# Patient Record
Sex: Female | Born: 1975 | Race: White | Hispanic: No | Marital: Married | Smoking: Never smoker
Health system: Southern US, Community
[De-identification: ages and names within clinical notes are randomized; demographics above are authoritative.]

## PROBLEM LIST (undated history)

## (undated) DIAGNOSIS — Z9889 Other specified postprocedural states: Secondary | ICD-10-CM

## (undated) DIAGNOSIS — R112 Nausea with vomiting, unspecified: Secondary | ICD-10-CM

## (undated) DIAGNOSIS — E039 Hypothyroidism, unspecified: Secondary | ICD-10-CM

## (undated) DIAGNOSIS — J309 Allergic rhinitis, unspecified: Secondary | ICD-10-CM

## (undated) DIAGNOSIS — C801 Malignant (primary) neoplasm, unspecified: Secondary | ICD-10-CM

## (undated) HISTORY — PX: BREAST BIOPSY: SHX20

## (undated) HISTORY — DX: Allergic rhinitis, unspecified: J30.9

---

## 1994-01-03 HISTORY — PX: TONSILLECTOMY: SUR1361

## 2014-01-03 HISTORY — PX: TUBAL LIGATION: SHX77

## 2016-01-04 HISTORY — PX: ABDOMINAL HYSTERECTOMY: SHX81

## 2016-11-29 HISTORY — PX: HYSTEROTOMY: SHX1776

## 2017-01-06 DIAGNOSIS — E069 Thyroiditis, unspecified: Secondary | ICD-10-CM | POA: Diagnosis not present

## 2017-01-06 DIAGNOSIS — E0590 Thyrotoxicosis, unspecified without thyrotoxic crisis or storm: Secondary | ICD-10-CM | POA: Diagnosis not present

## 2017-01-06 DIAGNOSIS — E041 Nontoxic single thyroid nodule: Secondary | ICD-10-CM | POA: Diagnosis not present

## 2017-01-06 DIAGNOSIS — J342 Deviated nasal septum: Secondary | ICD-10-CM | POA: Diagnosis not present

## 2017-01-10 ENCOUNTER — Ambulatory Visit: Payer: BLUE CROSS/BLUE SHIELD | Admitting: Endocrinology

## 2017-01-10 ENCOUNTER — Encounter: Payer: Self-pay | Admitting: Endocrinology

## 2017-01-10 DIAGNOSIS — E0590 Thyrotoxicosis, unspecified without thyrotoxic crisis or storm: Secondary | ICD-10-CM

## 2017-01-10 LAB — TSH

## 2017-01-10 LAB — T4, FREE: FREE T4: 4.41 ng/dL — AB (ref 0.60–1.60)

## 2017-01-10 MED ORDER — METHIMAZOLE 10 MG PO TABS: 20.0000 mg | ORAL_TABLET | Freq: Two times a day (BID) | ORAL | 2 refills | Status: DC

## 2017-01-10 NOTE — Progress Notes (Signed)
Subjective:    Patient ID: Kaitlin Patterson, female    DOB: 1975-11-21, 42 y.o.   MRN: [id number]  HPI Pt is referred for hyperthyroidism.  Pt reports he was dx'ed with hyperthyroidism in 2018.  she has never been on therapy for this.  she has never had XRT to the anterior neck, or thyroid surgery.  She does not consume kelp or any other prescribed or non-prescribed thyroid medication.  she has never been on amiodarone.  Pt reports 1 year of slight swelling at the left ant neck. She has assoc pain x 1 month.  1 month ago, she was rx'ed prednisone dosepack, which helped temporarily.   No past medical history on file.  Past Surgical History:  Procedure Laterality Date  . CESAREAN SECTION N/A 2016   cesarean done when patient had twins  . HYSTEROTOMY Bilateral 11/29/2016   Partial all taken but overies.     Social History   Socioeconomic History  . Marital status: Married    Spouse name: Not on file  . Number of children: Not on file  . Years of education: Not on file  . Highest education level: Not on file  Social Needs  . Financial resource strain: Not on file  . Food insecurity - worry: Not on file  . Food insecurity - inability: Not on file  . Transportation needs - medical: Not on file  . Transportation needs - non-medical: Not on file  Occupational History  . Not on file  Tobacco Use  . Smoking status: Never Smoker  . Smokeless tobacco: Never Used  Substance and Sexual Activity  . Alcohol use: No    Frequency: Never  . Drug use: No  . Sexual activity: Not Currently    Partners: Male  Other Topics Concern  . Not on file  Social History Narrative  . Not on file    Current Outpatient Medications on File Prior to Visit  Medication Sig Dispense Refill  . Biotin 10000 MCG TABS Take 10 mcg by mouth 2 (two) times daily.    . diphenhydrAMINE (BENADRYL) 25 MG tablet Take 25 mg by mouth daily.    Marland Kitchen ibuprofen (ADVIL,MOTRIN) 200 MG tablet Take 200 mg by mouth daily as needed.    .  Multiple Vitamin (MULTI-VITAMIN PO) Take by mouth.     No current facility-administered medications on file prior to visit.     Allergies  Allergen Reactions  . Penicillins Rash    Family History  Problem Relation Age of Onset  . Hypothyroidism Mother     BP 94/62 (BP Location: Left Arm, Patient Position: Sitting, Cuff Size: Normal)   Pulse (!) 115   Wt 131 lb 9.6 oz (59.7 kg)   LMP 11/05/2016 (Approximate)   SpO2 98%    Review of Systems denies hoarseness, diplopia, fever, sob, polyuria, muscle weakness, edema, excessive diaphoresis, tremor, easy bruising, and rhinorrhea.  She has myalgias, freq BM's, heat intolerance, nausea, palpitations, headache, and anxiety.  She has lost a few lbs.       Objective:   Physical Exam VS: see vs page GEN: no distress HEAD: head: no deformity eyes: no periorbital swelling, no proptosis external nose and ears are normal mouth: no lesion seen NECK: exquisite guarding to light palpation.  This limits ewxam, but there appear to be slight enlargement R>L  CHEST WALL: no deformity.  LUNGS: clear to auscultation CV: reg rate and rhythm, no murmur ABD: abdomen is soft, nontender.  no hepatosplenomegaly.  not  distended.  no hernia MUSCULOSKELETAL: muscle bulk and strength are grossly normal.  no obvious joint swelling.  gait is normal and steady EXTEMITIES: no deformity.  no leg edema PULSES: DP pulses are intact bilaterally.   NEURO:  cn 2-12 grossly intact.   readily moves all 4's.  sensation is intact to touch on all 4's.  No tremor SKIN:  Normal texture and temperature.  No rash or suspicious lesion is visible.  Not diaphoretic. NODES:  None palpable at the neck. PSYCH: alert, well-oriented.  Does not appear anxious nor depressed.    outside test results are reviewed: TSH=0.12  [place]: small multinodular goiter (L>R, opposite of today's physical exam).    I have reviewed outside records, and summarized: Pt was noted to have suppressed  TSH, and referred here. Main symptoms were.  She saw ENT also, who advised against thyroidectomy.       Assessment & Plan:  Hyperthyroidism, new, uncertain etiology.  She has several factors favoring subacute thyroiditis.  Hypotension: in this setting, she cannot take a B-blocker for tachycardia.   Patient Instructions  blood tests are requested for you today.  We'll let you know about the results. If it is overactive, i'll prescribe for you a pill to slow the thyroid. Please come back for a follow-up appointment in 1 month.

## 2017-01-10 NOTE — Patient Instructions (Addendum)
blood tests are requested for you today.  We'll let you know about the results. If it is overactive, i'll prescribe for you a pill to slow the thyroid. Please come back for a follow-up appointment in 1 month.

## 2017-01-30 DIAGNOSIS — R928 Other abnormal and inconclusive findings on diagnostic imaging of breast: Secondary | ICD-10-CM | POA: Diagnosis not present

## 2017-01-30 DIAGNOSIS — R921 Mammographic calcification found on diagnostic imaging of breast: Secondary | ICD-10-CM | POA: Diagnosis not present

## 2017-02-10 ENCOUNTER — Ambulatory Visit: Payer: BLUE CROSS/BLUE SHIELD | Admitting: Endocrinology

## 2017-02-10 ENCOUNTER — Encounter: Payer: Self-pay | Admitting: Endocrinology

## 2017-02-10 VITALS — BP 110/64 | HR 73 | Wt 128.0 lb

## 2017-02-10 DIAGNOSIS — E0590 Thyrotoxicosis, unspecified without thyrotoxic crisis or storm: Secondary | ICD-10-CM

## 2017-02-10 LAB — T4, FREE: Free T4: 0.89 ng/dL (ref 0.60–1.60)

## 2017-02-10 LAB — TSH: TSH: <0.01 u[IU]/mL — ABNORMAL LOW (ref 0.35–4.50)

## 2017-02-10 MED ORDER — METHIMAZOLE 10 MG PO TABS: 10.0000 mg | ORAL_TABLET | Freq: Two times a day (BID) | ORAL | 2 refills | Status: DC

## 2017-02-10 NOTE — Progress Notes (Signed)
   Subjective:    Patient ID: Kaitlin Patterson, female    DOB: 06-19-1975, 42 y.o.   MRN: [id number]  HPI Pt returns for f/u of hyperthyroidism (dx'ed 2018; she chose tapazole rx; US showed small multinodular goiter; ENT advised against thyroidectomy).  On tapazole, pt states she feels much better in general.  Specifically, tremor and palpitations are resolved.  Past Medical History:  Diagnosis Date  . Allergic rhinitis     Past Surgical History:  Procedure Laterality Date  . CESAREAN SECTION N/A 2016   cesarean done when patient had twins  . HYSTEROTOMY Bilateral 11/29/2016   Partial all taken but overies.     Social History   Socioeconomic History  . Marital status: Married    Spouse name: Not on file  . Number of children: Not on file  . Years of education: Not on file  . Highest education level: Not on file  Social Needs  . Financial resource strain: Not on file  . Food insecurity - worry: Not on file  . Food insecurity - inability: Not on file  . Transportation needs - medical: Not on file  . Transportation needs - non-medical: Not on file  Occupational History  . Not on file  Tobacco Use  . Smoking status: Never Smoker  . Smokeless tobacco: Never Used  Substance and Sexual Activity  . Alcohol use: No    Frequency: Never  . Drug use: No  . Sexual activity: Not Currently    Partners: Male  Other Topics Concern  . Not on file  Social History Narrative  . Not on file    Current Outpatient Medications on File Prior to Visit  Medication Sig Dispense Refill  . diphenhydrAMINE (BENADRYL) 25 MG tablet Take 25 mg by mouth daily.    Marland Kitchen ibuprofen (ADVIL,MOTRIN) 200 MG tablet Take 200 mg by mouth daily as needed.    . Biotin 10000 MCG TABS Take 10 mcg by mouth 2 (two) times daily.    . Multiple Vitamin (MULTI-VITAMIN PO) Take by mouth.     No current facility-administered medications on file prior to visit.     Allergies  Allergen Reactions  . Penicillins Rash     Family History  Problem Relation Age of Onset  . Hypothyroidism Mother     BP 110/64 (BP Location: Left Arm, Patient Position: Sitting, Cuff Size: Normal)   Pulse 73   Wt 128 lb (58.1 kg)   LMP 11/05/2016 (Approximate)   SpO2 98%   Review of Systems Denies fever.     Objective:   Physical Exam VITAL SIGNS:  See vs page GENERAL: no distress NECK: There is no palpable thyroid enlargement.  No thyroid nodule is palpable.  No palpable lymphadenopathy at the anterior neck. Neuro: no tremor Skin: not diaphoretic      Assessment & Plan:  Hyperthyroidism: due for recheck  Patient Instructions  blood tests are requested for you today.  We'll let you know about the results. If ever you have fever while taking methimazole, stop it and call us, even if the reason is obvious, because of the risk of a rare side-effect.  Please come back for a follow-up appointment in 6 weeks.

## 2017-02-10 NOTE — Patient Instructions (Addendum)
blood tests are requested for you today.  We'll let you know about the results. If ever you have fever while taking methimazole, stop it and call us, even if the reason is obvious, because of the risk of a rare side-effect. Please come back for a follow-up appointment in 6 weeks.  

## 2017-02-12 ENCOUNTER — Encounter: Payer: Self-pay | Admitting: Endocrinology

## 2017-02-28 ENCOUNTER — Telehealth: Payer: Self-pay | Admitting: Endocrinology

## 2017-02-28 NOTE — Telephone Encounter (Signed)
Most recent office note faxed per request.

## 2017-02-28 NOTE — Telephone Encounter (Signed)
°  [facility] are requesting the patients office notes faxed over to them   Thanks!      Fax- [phone]

## 2017-03-24 ENCOUNTER — Ambulatory Visit: Payer: BLUE CROSS/BLUE SHIELD | Admitting: Endocrinology

## 2017-03-24 ENCOUNTER — Encounter: Payer: Self-pay | Admitting: Endocrinology

## 2017-03-24 VITALS — BP 108/68 | HR 77 | Wt 134.0 lb

## 2017-03-24 DIAGNOSIS — E0590 Thyrotoxicosis, unspecified without thyrotoxic crisis or storm: Secondary | ICD-10-CM | POA: Diagnosis not present

## 2017-03-24 LAB — TSH: TSH: 77.53 u[IU]/mL — ABNORMAL HIGH (ref 0.35–4.50)

## 2017-03-24 LAB — T4, FREE: FREE T4: 0.33 ng/dL — AB (ref 0.60–1.60)

## 2017-03-24 MED ORDER — LEVOTHYROXINE SODIUM 75 MCG PO TABS: 75.0000 ug | ORAL_TABLET | Freq: Every day | ORAL | 1 refills | Status: DC

## 2017-03-24 NOTE — Patient Instructions (Addendum)
blood tests are requested for you today.  We'll let you know about the results.   If ever you have fever while taking methimazole, stop it and call us, even if the reason is obvious, because of the risk of a rare side-effect.  Please come back for a follow-up appointment in 2 months.   

## 2017-03-24 NOTE — Progress Notes (Signed)
Subjective:    Patient ID: Kaitlin Patterson, female    DOB: 1975-03-20, 42 y.o.   MRN: [id number]  HPI Pt returns for f/u of hyperthyroidism (dx'ed 2018; she chose tapazole rx; US showed small multinodular goiter; ENT advised against thyroidectomy).  On tapazole, pt states she feels much better in general.  Specifically, tremor and palpitations are resolved.  Past Medical History:  Diagnosis Date  . Allergic rhinitis     Past Surgical History:  Procedure Laterality Date  . CESAREAN SECTION N/A 2016   cesarean done when patient had twins  . HYSTEROTOMY Bilateral 11/29/2016   Partial all taken but overies.     Social History   Socioeconomic History  . Marital status: Married    Spouse name: Not on file  . Number of children: Not on file  . Years of education: Not on file  . Highest education level: Not on file  Occupational History  . Not on file  Social Needs  . Financial resource strain: Not on file  . Food insecurity:    Worry: Not on file    Inability: Not on file  . Transportation needs:    Medical: Not on file    Non-medical: Not on file  Tobacco Use  . Smoking status: Never Smoker  . Smokeless tobacco: Never Used  Substance and Sexual Activity  . Alcohol use: No    Frequency: Never  . Drug use: No  . Sexual activity: Not Currently    Partners: Male  Lifestyle  . Physical activity:    Days per week: Not on file    Minutes per session: Not on file  . Stress: Not on file  Relationships  . Social connections:    Talks on phone: Not on file    Gets together: Not on file    Attends religious service: Not on file    Active member of club or organization: Not on file    Attends meetings of clubs or organizations: Not on file    Relationship status: Not on file  . Intimate partner violence:    Fear of current or ex partner: Not on file    Emotionally abused: Not on file    Physically abused: Not on file    Forced sexual activity: Not on file  Other Topics Concern   . Not on file  Social History Narrative  . Not on file    Current Outpatient Medications on File Prior to Visit  Medication Sig Dispense Refill  . diphenhydrAMINE (BENADRYL) 25 MG tablet Take 25 mg by mouth daily.    Marland Kitchen ibuprofen (ADVIL,MOTRIN) 200 MG tablet Take 200 mg by mouth daily as needed.    . Biotin 10000 MCG TABS Take 10 mcg by mouth 2 (two) times daily.    . Multiple Vitamin (MULTI-VITAMIN PO) Take by mouth.     No current facility-administered medications on file prior to visit.     Allergies  Allergen Reactions  . Penicillins Rash    Family History  Problem Relation Age of Onset  . Hypothyroidism Mother     BP 108/68 (BP Location: Left Arm, Patient Position: Sitting, Cuff Size: Normal)   Pulse 77   Wt 134 lb (60.8 kg)   LMP 11/05/2016 (Approximate)   SpO2 99%    Review of Systems Denies fever    Objective:   Physical Exam VITAL SIGNS:  See vs page GENERAL: no distress NECK: There is no palpable thyroid enlargement.  No thyroid nodule is  palpable.  No palpable lymphadenopathy at the anterior neck. Neuro: no tremor Skin: not diaphoretic   Lab Results  Component Value Date   TSH 77.53 (H) 03/24/2017      Assessment & Plan:  Subacute thyroiditis, now in hypothyroid phase.    Patient Instructions  blood tests are requested for you today.  We'll let you know about the results. If ever you have fever while taking methimazole, stop it and call us, even if the reason is obvious, because of the risk of a rare side-effect.  Please come back for a follow-up appointment in 2 months.

## 2017-04-07 ENCOUNTER — Other Ambulatory Visit (INDEPENDENT_AMBULATORY_CARE_PROVIDER_SITE_OTHER): Payer: BLUE CROSS/BLUE SHIELD

## 2017-04-07 DIAGNOSIS — E0590 Thyrotoxicosis, unspecified without thyrotoxic crisis or storm: Secondary | ICD-10-CM | POA: Diagnosis not present

## 2017-04-07 LAB — T4, FREE: Free T4: 0.91 ng/dL (ref 0.60–1.60)

## 2017-04-07 LAB — TSH: TSH: 3.41 u[IU]/mL (ref 0.35–4.50)

## 2017-05-17 ENCOUNTER — Other Ambulatory Visit: Payer: Self-pay | Admitting: Endocrinology

## 2017-05-24 ENCOUNTER — Encounter: Payer: Self-pay | Admitting: Endocrinology

## 2017-05-24 ENCOUNTER — Ambulatory Visit: Payer: BLUE CROSS/BLUE SHIELD | Admitting: Endocrinology

## 2017-05-24 DIAGNOSIS — E061 Subacute thyroiditis: Secondary | ICD-10-CM | POA: Insufficient documentation

## 2017-05-24 LAB — TSH: TSH: 1.82 u[IU]/mL (ref 0.35–4.50)

## 2017-05-24 LAB — T4, FREE: FREE T4: 0.97 ng/dL (ref 0.60–1.60)

## 2017-05-24 NOTE — Progress Notes (Signed)
Subjective:    Patient ID: Kaitlin Patterson, female    DOB: September 15, 1975, 42 y.o.   MRN: [id number]  HPI Pt returns for f/u of subacute thyroiditis (dx'ed 2018; she chose tapazole rx; US showed small multinodular goiter; ENT advised against thyroidectomy; in ealry 2019, she developed hypothyroidism, and started synthroid).  pt states she feels well in general.  Past Medical History:  Diagnosis Date  . Allergic rhinitis     Past Surgical History:  Procedure Laterality Date  . CESAREAN SECTION N/A 2016   cesarean done when patient had twins  . HYSTEROTOMY Bilateral 11/29/2016   Partial all taken but overies.     Social History   Socioeconomic History  . Marital status: Married    Spouse name: Not on file  . Number of children: Not on file  . Years of education: Not on file  . Highest education level: Not on file  Occupational History  . Not on file  Social Needs  . Financial resource strain: Not on file  . Food insecurity:    Worry: Not on file    Inability: Not on file  . Transportation needs:    Medical: Not on file    Non-medical: Not on file  Tobacco Use  . Smoking status: Never Smoker  . Smokeless tobacco: Never Used  Substance and Sexual Activity  . Alcohol use: No    Frequency: Never  . Drug use: No  . Sexual activity: Not Currently    Partners: Male  Lifestyle  . Physical activity:    Days per week: Not on file    Minutes per session: Not on file  . Stress: Not on file  Relationships  . Social connections:    Talks on phone: Not on file    Gets together: Not on file    Attends religious service: Not on file    Active member of club or organization: Not on file    Attends meetings of clubs or organizations: Not on file    Relationship status: Not on file  . Intimate partner violence:    Fear of current or ex partner: Not on file    Emotionally abused: Not on file    Physically abused: Not on file    Forced sexual activity: Not on file  Other Topics Concern   . Not on file  Social History Narrative  . Not on file    Current Outpatient Medications on File Prior to Visit  Medication Sig Dispense Refill  . diphenhydrAMINE (BENADRYL) 25 MG tablet Take 25 mg by mouth daily.    Marland Kitchen ibuprofen (ADVIL,MOTRIN) 200 MG tablet Take 200 mg by mouth daily as needed.    . Multiple Vitamin (MULTI-VITAMIN PO) Take by mouth.     No current facility-administered medications on file prior to visit.     Allergies  Allergen Reactions  . Penicillins Rash    Family History  Problem Relation Age of Onset  . Hypothyroidism Mother     BP 104/68   Pulse (!) 58   Wt 135 lb 12.8 oz (61.6 kg)   LMP 11/05/2016 (Approximate)   SpO2 98%   Review of Systems No weight change    Objective:   Physical Exam VITAL SIGNS:  See vs page GENERAL: no distress NECK: There is no palpable thyroid enlargement.  No thyroid nodule is palpable.  No palpable lymphadenopathy at the anterior neck.       Assessment & Plan:  Hypothyroidism: recheck today.  Multinodular  goiter: nonpalpable.  Since [place] said this was small, no need for f/u US unless some other reason  Patient Instructions  blood tests are requested for you today.  We'll let you know about the results.  Please come back for a follow-up appointment in 3-4 months.

## 2017-05-24 NOTE — Patient Instructions (Addendum)
blood tests are requested for you today.  We'll let you know about the results.  Please come back for a follow-up appointment in 3-4 months.

## 2017-05-27 MED ORDER — LEVOTHYROXINE SODIUM 75 MCG PO TABS
75.0000 ug | ORAL_TABLET | Freq: Every day | ORAL | 3 refills | Status: DC
Start: 2017-05-27 — End: 2018-05-22

## 2017-09-28 ENCOUNTER — Ambulatory Visit: Payer: BLUE CROSS/BLUE SHIELD | Admitting: Endocrinology

## 2017-10-04 ENCOUNTER — Ambulatory Visit: Payer: BLUE CROSS/BLUE SHIELD | Admitting: Endocrinology

## 2017-10-04 ENCOUNTER — Encounter: Payer: Self-pay | Admitting: Endocrinology

## 2017-10-04 VITALS — BP 98/58 | HR 76 | Ht 69.0 in | Wt 136.2 lb

## 2017-10-04 DIAGNOSIS — I959 Hypotension, unspecified: Secondary | ICD-10-CM | POA: Diagnosis not present

## 2017-10-04 DIAGNOSIS — E061 Subacute thyroiditis: Secondary | ICD-10-CM

## 2017-10-04 LAB — BASIC METABOLIC PANEL
BUN: 21 mg/dL (ref 6–23)
CALCIUM: 9.2 mg/dL (ref 8.4–10.5)
CO2: 28 mEq/L (ref 19–32)
CREATININE: 0.87 mg/dL (ref 0.40–1.20)
Chloride: 106 mEq/L (ref 96–112)
GFR: 75.14 mL/min (ref >60.00)
GLUCOSE: 79 mg/dL (ref 70–99)
Potassium: 3.9 mEq/L (ref 3.5–5.1)
Sodium: 141 mEq/L (ref 135–145)

## 2017-10-04 LAB — CORTISOL: Cortisol, Plasma: 4.3 ug/dL

## 2017-10-04 LAB — TSH: TSH: 1.26 u[IU]/mL (ref 0.35–4.50)

## 2017-10-04 LAB — T4, FREE: Free T4: 1.02 ng/dL (ref 0.60–1.60)

## 2017-10-04 NOTE — Progress Notes (Signed)
Subjective:    Patient ID: Kaitlin Patterson, female    DOB: 11/06/1975, 42 y.o.   MRN: [id number]  HPI Pt returns for f/u of subacute thyroiditis (dx'ed 2018; she chose tapazole rx; US showed small multinodular goiter; ENT advised against thyroidectomy; in ealry 2019, she developed hypothyroidism, and started synthroid).  pt states she feels well in general, except for fatigue and difficulty with concentration.    Past Medical History:  Diagnosis Date  . Allergic rhinitis     Past Surgical History:  Procedure Laterality Date  . CESAREAN SECTION N/A 2016   cesarean done when patient had twins  . HYSTEROTOMY Bilateral 11/29/2016   Partial all taken but overies.     Social History   Socioeconomic History  . Marital status: Married    Spouse name: Not on file  . Number of children: Not on file  . Years of education: Not on file  . Highest education level: Not on file  Occupational History  . Not on file  Social Needs  . Financial resource strain: Not on file  . Food insecurity:    Worry: Not on file    Inability: Not on file  . Transportation needs:    Medical: Not on file    Non-medical: Not on file  Tobacco Use  . Smoking status: Never Smoker  . Smokeless tobacco: Never Used  Substance and Sexual Activity  . Alcohol use: No    Frequency: Never  . Drug use: No  . Sexual activity: Not Currently    Partners: Male  Lifestyle  . Physical activity:    Days per week: Not on file    Minutes per session: Not on file  . Stress: Not on file  Relationships  . Social connections:    Talks on phone: Not on file    Gets together: Not on file    Attends religious service: Not on file    Active member of club or organization: Not on file    Attends meetings of clubs or organizations: Not on file    Relationship status: Not on file  . Intimate partner violence:    Fear of current or ex partner: Not on file    Emotionally abused: Not on file    Physically abused: Not on file   Forced sexual activity: Not on file  Other Topics Concern  . Not on file  Social History Narrative  . Not on file    Current Outpatient Medications on File Prior to Visit  Medication Sig Dispense Refill  . diphenhydrAMINE (BENADRYL) 25 MG tablet Take 25 mg by mouth daily.    Marland Kitchen ibuprofen (ADVIL,MOTRIN) 200 MG tablet Take 200 mg by mouth daily as needed.    Marland Kitchen levothyroxine (SYNTHROID, LEVOTHROID) 75 MCG tablet Take 1 tablet (75 mcg total) by mouth daily before breakfast. 90 tablet 3   No current facility-administered medications on file prior to visit.     Allergies  Allergen Reactions  . Penicillins Rash    Family History  Problem Relation Age of Onset  . Hypothyroidism Mother     BP (!) 98/58   Pulse 76   Ht 5\' 9"  (1.753 m)   Wt 136 lb 3.2 oz (61.8 kg)   LMP 11/05/2016 (Approximate)   SpO2 97%   BMI 20.11 kg/m    Review of Systems No weight change    Objective:   Physical Exam VITAL SIGNS:  See vs page GENERAL: no distress NECK: There is no palpable  thyroid enlargement.  No thyroid nodule is palpable.  No palpable lymphadenopathy at the anterior neck.      Lab Results  Component Value Date   TSH 1.26 10/04/2017      Assessment & Plan:  Hypothyroidism: well-replaced.  Please continue the same medication.   Subacute thyroiditis: in this setting, she is at risk for ongoing variable thyroid function.  Please come back for a follow-up appointment in 4 months

## 2017-10-04 NOTE — Patient Instructions (Addendum)
blood tests are requested for you today.  We'll let you know about the results. Please come back for a follow-up appointment in 4 months.     

## 2018-02-05 ENCOUNTER — Ambulatory Visit: Payer: BLUE CROSS/BLUE SHIELD | Admitting: Endocrinology

## 2018-02-05 ENCOUNTER — Encounter: Payer: Self-pay | Admitting: Endocrinology

## 2018-02-05 VITALS — BP 100/62 | HR 83 | Ht 69.0 in | Wt 139.2 lb

## 2018-02-05 DIAGNOSIS — E061 Subacute thyroiditis: Secondary | ICD-10-CM

## 2018-02-05 LAB — TSH: TSH: 1.42 u[IU]/mL (ref 0.35–4.50)

## 2018-02-05 LAB — T4, FREE: FREE T4: 0.76 ng/dL (ref 0.60–1.60)

## 2018-02-05 NOTE — Progress Notes (Signed)
Subjective:    Patient ID: Kaitlin Patterson, female    DOB: [birth year], 43 y.o.   MRN: [id number]  HPI Pt returns for f/u of subacute thyroiditis (dx'ed 2018; she chose tapazole rx; US showed small multinodular goiter; ENT advised against thyroidectomy; in early 2019, she developed hypothyroidism, and started synthroid).  pt reports difficulty with concentration and hair loss.   Past Medical History:  Diagnosis Date  . Allergic rhinitis     Past Surgical History:  Procedure Laterality Date  . CESAREAN SECTION N/A 2016   cesarean done when patient had twins  . HYSTEROTOMY Bilateral 11/29/2016   Partial all taken but overies.     Social History   Socioeconomic History  . Marital status: Married    Spouse name: Not on file  . Number of children: Not on file  . Years of education: Not on file  . Highest education level: Not on file  Occupational History  . Not on file  Social Needs  . Financial resource strain: Not on file  . Food insecurity:    Worry: Not on file    Inability: Not on file  . Transportation needs:    Medical: Not on file    Non-medical: Not on file  Tobacco Use  . Smoking status: Never Smoker  . Smokeless tobacco: Never Used  Substance and Sexual Activity  . Alcohol use: No    Frequency: Never  . Drug use: No  . Sexual activity: Not Currently    Partners: Male  Lifestyle  . Physical activity:    Days per week: Not on file    Minutes per session: Not on file  . Stress: Not on file  Relationships  . Social connections:    Talks on phone: Not on file    Gets together: Not on file    Attends religious service: Not on file    Active member of club or organization: Not on file    Attends meetings of clubs or organizations: Not on file    Relationship status: Not on file  . Intimate partner violence:    Fear of current or ex partner: Not on file    Emotionally abused: Not on file    Physically abused: Not on file    Forced sexual activity: Not on file    Other Topics Concern  . Not on file  Social History Narrative  . Not on file    Current Outpatient Medications on File Prior to Visit  Medication Sig Dispense Refill  . diphenhydrAMINE (BENADRYL) 25 MG tablet Take 25 mg by mouth daily.    Marland Kitchen levothyroxine (SYNTHROID, LEVOTHROID) 75 MCG tablet Take 1 tablet (75 mcg total) by mouth daily before breakfast. 90 tablet 3   No current facility-administered medications on file prior to visit.     Allergies  Allergen Reactions  . Penicillins Rash    Family History  Problem Relation Age of Onset  . Hypothyroidism Mother     BP 100/62 (BP Location: Right Arm, Patient Position: Sitting, Cuff Size: Normal)   Pulse 83   Ht 5\' 9"  (1.753 m)   Wt 139 lb 3.2 oz (63.1 kg)   LMP 11/05/2016 (Approximate)   SpO2 98%   BMI 20.56 kg/m    Review of Systems Denies neck swelling and pain.      Objective:   Physical Exam VITAL SIGNS:  See vs page GENERAL: no distress NECK: There is no palpable thyroid enlargement.  No thyroid nodule is palpable.  No palpable lymphadenopathy at the anterior neck.    Lab Results  Component Value Date   TSH 1.42 02/05/2018       Assessment & Plan:  Hypothyroidism: well-replaced Thyroiditis, which is now chronic.  We discussed the fact that dosage requirement will likely increase with time.    Patient Instructions  Thyroid blood tests are requested for you today.  We'll let you know about the results.  Please come back for a follow-up appointment in 6 months.       Hypothyroidism  Hypothyroidism is when the thyroid gland does not make enough of certain hormones (it is underactive). The thyroid gland is a small gland located in the lower front part of the neck, just in front of the windpipe (trachea). This gland makes hormones that help control how the body uses food for energy (metabolism) as well as how the heart and brain function. These hormones also play a role in keeping your bones strong. When  the thyroid is underactive, it produces too little of the hormones thyroxine (T4) and triiodothyronine (T3). What are the causes? This condition may be caused by:  Hashimoto's disease. This is a disease in which the body's disease-fighting system (immune system) attacks the thyroid gland. This is the most common cause.  Viral infections.  Pregnancy.  Certain medicines.  Birth defects.  Past radiation treatments to the head or neck for cancer.  Past treatment with radioactive iodine.  Past exposure to radiation in the environment.  Past surgical removal of part or all of the thyroid.  Problems with a gland in the center of the brain (pituitary gland).  Lack of enough iodine in the diet. What increases the risk? You are more likely to develop this condition if:  You are female.  You have a family history of thyroid conditions.  You use a medicine called lithium.  You take medicines that affect the immune system (immunosuppressants). What are the signs or symptoms? Symptoms of this condition include:  Feeling as though you have no energy (lethargy).  Not being able to tolerate cold.  Weight gain that is not explained by a change in diet or exercise habits.  Lack of appetite.  Dry skin.  Coarse hair.  Menstrual irregularity.  Slowing of thought processes.  Constipation.  Sadness or depression. How is this diagnosed? This condition may be diagnosed based on:  Your symptoms, your medical history, and a physical exam.  Blood tests. You may also have imaging tests, such as an ultrasound or MRI. How is this treated? This condition is treated with medicine that replaces the thyroid hormones that your body does not make. After you begin treatment, it may take several weeks for symptoms to go away. Follow these instructions at home:  Take over-the-counter and prescription medicines only as told by your health care provider.  If you start taking any new  medicines, tell your health care provider.  Keep all follow-up visits as told by your health care provider. This is important. ? As your condition improves, your dosage of thyroid hormone medicine may change. ? You will need to have blood tests regularly so that your health care provider can monitor your condition. Contact a health care provider if:  Your symptoms do not get better with treatment.  You are taking thyroid replacement medicine and you: ? Sweat a lot. ? Have tremors. ? Feel anxious. ? Lose weight rapidly. ? Cannot tolerate heat. ? Have emotional swings. ? Have diarrhea. ? Feel weak. Get  help right away if you have:  Chest pain.  An irregular heartbeat.  A rapid heartbeat.  Difficulty breathing. Summary  Hypothyroidism is when the thyroid gland does not make enough of certain hormones (it is underactive).  When the thyroid is underactive, it produces too little of the hormones thyroxine (T4) and triiodothyronine (T3).  The most common cause is Hashimoto's disease, a disease in which the body's disease-fighting system (immune system) attacks the thyroid gland. The condition can also be caused by viral infections, medicine, pregnancy, or past radiation treatment to the head or neck.  Symptoms may include weight gain, dry skin, constipation, feeling as though you do not have energy, and not being able to tolerate cold.  This condition is treated with medicine to replace the thyroid hormones that your body does not make. This information is not intended to replace advice given to you by your health care provider. Make sure you discuss any questions you have with your health care provider. Document Released: 12/20/2004 Document Revised: 11/30/2016 Document Reviewed: 11/30/2016 Elsevier Interactive Patient Education  2019 [employer].

## 2018-02-05 NOTE — Patient Instructions (Addendum)
Thyroid blood tests are requested for you today.  We'll let you know about the results.  Please come back for a follow-up appointment in 6 months.       Hypothyroidism  Hypothyroidism is when the thyroid gland does not make enough of certain hormones (it is underactive). The thyroid gland is a small gland located in the lower front part of the neck, just in front of the windpipe (trachea). This gland makes hormones that help control how the body uses food for energy (metabolism) as well as how the heart and brain function. These hormones also play a role in keeping your bones strong. When the thyroid is underactive, it produces too little of the hormones thyroxine (T4) and triiodothyronine (T3). What are the causes? This condition may be caused by:  Hashimoto's disease. This is a disease in which the body's disease-fighting system (immune system) attacks the thyroid gland. This is the most common cause.  Viral infections.  Pregnancy.  Certain medicines.  Birth defects.  Past radiation treatments to the head or neck for cancer.  Past treatment with radioactive iodine.  Past exposure to radiation in the environment.  Past surgical removal of part or all of the thyroid.  Problems with a gland in the center of the brain (pituitary gland).  Lack of enough iodine in the diet. What increases the risk? You are more likely to develop this condition if:  You are female.  You have a family history of thyroid conditions.  You use a medicine called lithium.  You take medicines that affect the immune system (immunosuppressants). What are the signs or symptoms? Symptoms of this condition include:  Feeling as though you have no energy (lethargy).  Not being able to tolerate cold.  Weight gain that is not explained by a change in diet or exercise habits.  Lack of appetite.  Dry skin.  Coarse hair.  Menstrual irregularity.  Slowing of thought  processes.  Constipation.  Sadness or depression. How is this diagnosed? This condition may be diagnosed based on:  Your symptoms, your medical history, and a physical exam.  Blood tests. You may also have imaging tests, such as an ultrasound or MRI. How is this treated? This condition is treated with medicine that replaces the thyroid hormones that your body does not make. After you begin treatment, it may take several weeks for symptoms to go away. Follow these instructions at home:  Take over-the-counter and prescription medicines only as told by your health care provider.  If you start taking any new medicines, tell your health care provider.  Keep all follow-up visits as told by your health care provider. This is important. ? As your condition improves, your dosage of thyroid hormone medicine may change. ? You will need to have blood tests regularly so that your health care provider can monitor your condition. Contact a health care provider if:  Your symptoms do not get better with treatment.  You are taking thyroid replacement medicine and you: ? Sweat a lot. ? Have tremors. ? Feel anxious. ? Lose weight rapidly. ? Cannot tolerate heat. ? Have emotional swings. ? Have diarrhea. ? Feel weak. Get help right away if you have:  Chest pain.  An irregular heartbeat.  A rapid heartbeat.  Difficulty breathing. Summary  Hypothyroidism is when the thyroid gland does not make enough of certain hormones (it is underactive).  When the thyroid is underactive, it produces too little of the hormones thyroxine (T4) and triiodothyronine (T3).  The  most common cause is Hashimoto's disease, a disease in which the body's disease-fighting system (immune system) attacks the thyroid gland. The condition can also be caused by viral infections, medicine, pregnancy, or past radiation treatment to the head or neck.  Symptoms may include weight gain, dry skin, constipation, feeling as  though you do not have energy, and not being able to tolerate cold.  This condition is treated with medicine to replace the thyroid hormones that your body does not make. This information is not intended to replace advice given to you by your health care provider. Make sure you discuss any questions you have with your health care provider. Document Released: 12/20/2004 Document Revised: 11/30/2016 Document Reviewed: 11/30/2016 Elsevier Interactive Patient Education  2019 [employer].

## 2018-02-20 DIAGNOSIS — N644 Mastodynia: Secondary | ICD-10-CM | POA: Diagnosis not present

## 2018-02-20 DIAGNOSIS — Z1231 Encounter for screening mammogram for malignant neoplasm of breast: Secondary | ICD-10-CM | POA: Diagnosis not present

## 2018-03-19 DIAGNOSIS — Z1231 Encounter for screening mammogram for malignant neoplasm of breast: Secondary | ICD-10-CM | POA: Diagnosis not present

## 2018-03-21 DIAGNOSIS — L814 Other melanin hyperpigmentation: Secondary | ICD-10-CM | POA: Diagnosis not present

## 2018-03-21 DIAGNOSIS — D225 Melanocytic nevi of trunk: Secondary | ICD-10-CM | POA: Diagnosis not present

## 2018-03-21 DIAGNOSIS — D1801 Hemangioma of skin and subcutaneous tissue: Secondary | ICD-10-CM | POA: Diagnosis not present

## 2018-03-21 DIAGNOSIS — D485 Neoplasm of uncertain behavior of skin: Secondary | ICD-10-CM | POA: Diagnosis not present

## 2018-03-21 DIAGNOSIS — C44519 Basal cell carcinoma of skin of other part of trunk: Secondary | ICD-10-CM | POA: Diagnosis not present

## 2018-03-22 DIAGNOSIS — R928 Other abnormal and inconclusive findings on diagnostic imaging of breast: Secondary | ICD-10-CM | POA: Diagnosis not present

## 2018-03-22 DIAGNOSIS — R921 Mammographic calcification found on diagnostic imaging of breast: Secondary | ICD-10-CM | POA: Diagnosis not present

## 2018-03-27 DIAGNOSIS — C44519 Basal cell carcinoma of skin of other part of trunk: Secondary | ICD-10-CM | POA: Diagnosis not present

## 2018-05-22 ENCOUNTER — Other Ambulatory Visit: Payer: Self-pay | Admitting: Endocrinology

## 2018-05-29 DIAGNOSIS — R921 Mammographic calcification found on diagnostic imaging of breast: Secondary | ICD-10-CM | POA: Diagnosis not present

## 2018-05-30 DIAGNOSIS — D0511 Intraductal carcinoma in situ of right breast: Secondary | ICD-10-CM | POA: Diagnosis not present

## 2018-05-30 DIAGNOSIS — R921 Mammographic calcification found on diagnostic imaging of breast: Secondary | ICD-10-CM | POA: Diagnosis not present

## 2018-06-05 DIAGNOSIS — N6311 Unspecified lump in the right breast, upper outer quadrant: Secondary | ICD-10-CM | POA: Diagnosis not present

## 2018-06-05 DIAGNOSIS — D0511 Intraductal carcinoma in situ of right breast: Secondary | ICD-10-CM | POA: Diagnosis not present

## 2018-06-05 DIAGNOSIS — N6321 Unspecified lump in the left breast, upper outer quadrant: Secondary | ICD-10-CM | POA: Diagnosis not present

## 2018-06-07 ENCOUNTER — Telehealth: Payer: Self-pay | Admitting: Plastic Surgery

## 2018-06-07 ENCOUNTER — Other Ambulatory Visit: Payer: Self-pay | Admitting: Surgery

## 2018-06-07 DIAGNOSIS — D0511 Intraductal carcinoma in situ of right breast: Secondary | ICD-10-CM | POA: Diagnosis not present

## 2018-06-07 DIAGNOSIS — Z853 Personal history of malignant neoplasm of breast: Secondary | ICD-10-CM

## 2018-06-07 DIAGNOSIS — R9389 Abnormal findings on diagnostic imaging of other specified body structures: Secondary | ICD-10-CM

## 2018-06-07 NOTE — Telephone Encounter (Signed)

## 2018-06-08 ENCOUNTER — Ambulatory Visit
Admission: RE | Admit: 2018-06-08 | Discharge: 2018-06-08 | Disposition: A | Discharge status: Home / Self Care | Payer: BC Managed Care – PPO | Source: Ambulatory Visit | Attending: Surgery | Admitting: Surgery

## 2018-06-08 ENCOUNTER — Ambulatory Visit: Payer: BC Managed Care – PPO | Admitting: Plastic Surgery

## 2018-06-08 ENCOUNTER — Institutional Professional Consult (permissible substitution): Payer: BLUE CROSS/BLUE SHIELD | Admitting: Plastic Surgery

## 2018-06-08 ENCOUNTER — Other Ambulatory Visit: Payer: Self-pay | Admitting: Surgery

## 2018-06-08 ENCOUNTER — Encounter: Payer: Self-pay | Admitting: Plastic Surgery

## 2018-06-08 ENCOUNTER — Other Ambulatory Visit: Payer: Self-pay

## 2018-06-08 DIAGNOSIS — R928 Other abnormal and inconclusive findings on diagnostic imaging of breast: Secondary | ICD-10-CM | POA: Diagnosis not present

## 2018-06-08 DIAGNOSIS — N6011 Diffuse cystic mastopathy of right breast: Secondary | ICD-10-CM | POA: Diagnosis not present

## 2018-06-08 DIAGNOSIS — N6012 Diffuse cystic mastopathy of left breast: Secondary | ICD-10-CM | POA: Diagnosis not present

## 2018-06-08 DIAGNOSIS — C50911 Malignant neoplasm of unspecified site of right female breast: Secondary | ICD-10-CM | POA: Diagnosis not present

## 2018-06-08 DIAGNOSIS — R9389 Abnormal findings on diagnostic imaging of other specified body structures: Secondary | ICD-10-CM

## 2018-06-08 DIAGNOSIS — C50919 Malignant neoplasm of unspecified site of unspecified female breast: Secondary | ICD-10-CM | POA: Insufficient documentation

## 2018-06-08 MED ORDER — GADOBUTROL 1 MMOL/ML IV SOLN
6.0000 mL | Freq: Once | INTRAVENOUS | Status: AC | PRN
  Administered 2018-06-08: 6 mL via INTRAVENOUS

## 2018-06-08 NOTE — Progress Notes (Addendum)
Patient ID: Kaitlin Patterson, female    DOB: April 23, 1975, 43 y.o.   MRN: [id number]   Chief Complaint  Patient presents with  . Advice Only    for reduction    Patient is a 43 year old white female who is here for a consultation for breast reconstruction.  Her original consult was for a reduction.  In the last several weeks she underwent a mammogram as a routine and was found to have some abnormalities.  This led to an MRI which showed further areas of concern.  She has been told she has a right sided breast cancer.  She is 5 feet 9 inches tall.  She weighs 141 pounds.  Her preoperative bra is a 32 G.   She would like to be a B/C cup.  Has genetic counseling set up, a biopsy on the left scheduled and a consult with oncology.  She has thyroid is which is under control.  She is otherwise in good health she has a son graduating at the end of July.  She has a beach trip planned in July as well.  She would like to be able to keep that vacation if possible.  She is certainly willing to do what is needed.  She has significantly large breasts with significant ptosis she would not be able to have nipple sparing mastectomies.    Review of Systems  Constitutional: Negative.  Negative for activity change.  HENT: Negative.   Eyes: Negative.   Respiratory: Negative.  Negative for chest tightness and shortness of breath.   Cardiovascular: Negative.  Negative for leg swelling.  Gastrointestinal: Negative.   Endocrine: Negative.   Genitourinary: Negative.   Musculoskeletal: Positive for back pain and neck pain.  Skin: Negative.   Neurological: Negative.   Hematological: Negative.   Psychiatric/Behavioral: Negative.     Past Medical History:  Diagnosis Date  . Allergic rhinitis     Past Surgical History:  Procedure Laterality Date  . CESAREAN SECTION N/A 2016   cesarean done when patient had twins  . HYSTEROTOMY Bilateral 11/29/2016   Partial all taken but overies.       Current Outpatient  Medications:  .  Biotin 10 MG CAPS, , Disp: , Rfl:  .  diphenhydrAMINE (BENADRYL) 25 MG tablet, Take 25 mg by mouth daily., Disp: , Rfl:  .  levothyroxine (SYNTHROID) 75 MCG tablet, TAKE 1 TABLET (75 MCG TOTAL) BY MOUTH DAILY BEFORE BREAKFAST., Disp: 90 tablet, Rfl: 3   Objective:   Vitals:   06/08/18 1135  BP: (!) 120/59  Pulse: 87  Temp: 98.4 F (36.9 C)  SpO2: 99%    Physical Exam Vitals signs and nursing note reviewed.  Constitutional:      Appearance: Normal appearance.  HENT:     Head: Normocephalic and atraumatic.  Cardiovascular:     Rate and Rhythm: Normal rate.     Pulses: Normal pulses.  Pulmonary:     Effort: Pulmonary effort is normal. No respiratory distress.  Abdominal:     General: Abdomen is flat. There is no distension.     Tenderness: There is no abdominal tenderness.  Musculoskeletal: Normal range of motion.  Skin:    General: Skin is warm.  Neurological:     General: No focal deficit present.     Mental Status: She is alert and oriented to person, place, and time.  Psychiatric:        Mood and Affect: Mood normal.  Behavior: Behavior normal.        Thought Content: Thought content normal.     Assessment & Plan:  Malignant neoplasm of right female breast, unspecified estrogen receptor status, unspecified site of breast ([place]) Assessment and Plan:  A long, detailed conversation was had regarding the patient's options for breast reconstruction. Five main points, which are explained to all breast reconstruction patients, were discussed.  1. Breast reconstruction is an optional process.  2. Breast reconstruction is a multi-stage process which involves multiple surgeries spaced several months apart. The entire process can take over one year.  3. The major goal of breast reconstruction is to have the patient look normal in clothing. When naked, there will always be scars.  4. Asymmetries are often present during the reconstruction process. Several  operations may be needed, including surgery to the non-cancerous breast, to achieve satisfactory results.  5. No matter the reconstructive method, there are ways that the reconstruction can fail and a secondary reconstructive plan would need to be created.   A general discussion regarding all available methods of breast reconstruction were discussed. The types of reconstructions described included.  1. Tissue expander and implant based reconstruction, both single and multi-stage approaches.  2. Autologous only reconstructions, including free abdominal-tissue based reconstructions.  3. Combination procedures, particularly latissismus dorsi flaps combined with either expanders or implants.  For each of the reconstruction methods mentioned above, the risks, benefits, alternatives, scarring, and recovery time were discussed in great detail. Specific risks detailed included bleeding, infection, hematoma, seroma, scarring, pain, wound healing complications, flap loss, fat necrosis, capsular contracture, need for implant removal, donor site complications, bulge, hernia, umbilical necrosis, need for urgent reoperation, and need for dressing changes were discussed.   Assessment  Once all reconstruction options were presented, a focused discussion was had regarding the patient's suitability for each of these procedures.  A total of 50 minutes of face-to-face time was spent in this encounter, of which >50% was spent in counseling. We reviewed several scenarios and options for reconstruction depending on the results of the biopsies.  She is leaning towards mastectomies with implant reconstruction.  She is open to the idea of breast conservation surgery with reductions but this will mostly depend on the biopsies.  She will get back to me as soon as she has seen genetics and oncology.  I placed a call to Dr. Lilia Pro and  discussed all of the above.   Pictures were obtained of the patient and placed in the chart with  the patient's or guardian's permission.    [place], DO

## 2018-06-13 DIAGNOSIS — Z808 Family history of malignant neoplasm of other organs or systems: Secondary | ICD-10-CM | POA: Diagnosis not present

## 2018-06-13 DIAGNOSIS — D0511 Intraductal carcinoma in situ of right breast: Secondary | ICD-10-CM | POA: Diagnosis not present

## 2018-06-13 DIAGNOSIS — Z8041 Family history of malignant neoplasm of ovary: Secondary | ICD-10-CM | POA: Diagnosis not present

## 2018-06-13 DIAGNOSIS — Z800 Family history of malignant neoplasm of digestive organs: Secondary | ICD-10-CM | POA: Diagnosis not present

## 2018-06-15 DIAGNOSIS — D0511 Intraductal carcinoma in situ of right breast: Secondary | ICD-10-CM | POA: Diagnosis not present

## 2018-06-26 ENCOUNTER — Telehealth: Payer: Self-pay | Admitting: Plastic Surgery

## 2018-06-26 NOTE — Telephone Encounter (Signed)

## 2018-06-27 ENCOUNTER — Other Ambulatory Visit: Payer: Self-pay

## 2018-06-27 ENCOUNTER — Ambulatory Visit: Payer: BC Managed Care – PPO | Admitting: Plastic Surgery

## 2018-06-27 ENCOUNTER — Encounter: Payer: Self-pay | Admitting: Plastic Surgery

## 2018-06-27 VITALS — BP 112/72 | HR 81 | Temp 98.1°F | Ht 69.0 in | Wt 140.0 lb

## 2018-06-27 DIAGNOSIS — C50911 Malignant neoplasm of unspecified site of right female breast: Secondary | ICD-10-CM | POA: Diagnosis not present

## 2018-06-27 NOTE — Progress Notes (Signed)
   Subjective:    Patient ID: Kaitlin Patterson, female    DOB: 1975-04-06, 43 y.o.   MRN: [id number]  The patient is a 43 yrs old wf here for further discussion for breast reconstruction.  Her genetics were negative.  Her additional biopsies were also negative.  She had originally made an appointment for a breast reduction when she was being worked up with the mammogram was found to have abnormalities.  She then had the MRI and biopsies and was positive for right sided breast cancer.  She is 5 feet 9 inches tall and weighs 141 pounds.  Her preoperative bra is a 32 G.  She would like to be a B or a C cup size.  She has a graduation for her son and vacation planned for July 25.  She would like to have the surgery following that.  She has significant ptosis and is aware that a mastectomy is planned with out salvage of the nipple areola complex.   Review of Systems  Constitutional: Negative for activity change and appetite change.  Eyes: Negative.   Respiratory: Negative for chest tightness and shortness of breath.   Gastrointestinal: Negative for abdominal pain.  Endocrine: Negative.   Genitourinary: Negative.   Musculoskeletal: Negative.  Negative for back pain.  Skin: Negative for color change and wound.  Neurological: Negative.   Psychiatric/Behavioral: Negative.        Objective:   Physical Exam Vitals signs and nursing note reviewed.  Constitutional:      Appearance: Normal appearance.  HENT:     Head: Normocephalic and atraumatic.     Nose: Nose normal.  Neck:     Musculoskeletal: Normal range of motion.  Cardiovascular:     Rate and Rhythm: Normal rate.     Pulses: Normal pulses.  Pulmonary:     Effort: Pulmonary effort is normal.  Abdominal:     General: Abdomen is flat. There is no distension.     Tenderness: There is no abdominal tenderness.  Skin:    General: Skin is warm.  Neurological:     General: No focal deficit present.     Mental Status: She is alert and oriented to  person, place, and time.  Psychiatric:        Mood and Affect: Mood normal.        Behavior: Behavior normal.        Thought Content: Thought content normal.        Judgment: Judgment normal.         Assessment & Plan:     ICD-10-CM   1. Malignant neoplasm of right female breast, unspecified estrogen receptor status, unspecified site of breast ([place])  C50.911     Plan for bilateral immediate breast reconstruction with expander and Flex HD placement.   We will do this with Dr. Lilia Pro here in [place].  Risks and complications were discussed again.  She is aware that the nipple areole is will not be salvaged.  She is also aware this is a staged procedure. A call was placed with Dr. Lilia Pro and all of this was discussed.  Expander order request sent electronically.

## 2018-06-29 ENCOUNTER — Ambulatory Visit: Payer: BC Managed Care – PPO | Admitting: Plastic Surgery

## 2018-08-01 ENCOUNTER — Telehealth: Payer: Self-pay | Admitting: Plastic Surgery

## 2018-08-01 NOTE — Telephone Encounter (Signed)

## 2018-08-02 ENCOUNTER — Other Ambulatory Visit: Payer: Self-pay

## 2018-08-02 ENCOUNTER — Encounter: Payer: Self-pay | Admitting: Plastic Surgery

## 2018-08-02 ENCOUNTER — Ambulatory Visit (INDEPENDENT_AMBULATORY_CARE_PROVIDER_SITE_OTHER): Payer: BC Managed Care – PPO | Admitting: Plastic Surgery

## 2018-08-02 VITALS — Temp 98.2°F | Resp 18 | Ht 69.0 in | Wt 139.0 lb

## 2018-08-02 DIAGNOSIS — C50911 Malignant neoplasm of unspecified site of right female breast: Secondary | ICD-10-CM

## 2018-08-02 MED ORDER — HYDROCODONE-ACETAMINOPHEN 5-325 MG PO TABS: 1.0000 | ORAL_TABLET | Freq: Three times a day (TID) | ORAL | 0 refills | Status: AC | PRN

## 2018-08-02 MED ORDER — DIAZEPAM 2 MG PO TABS: 2.0000 mg | ORAL_TABLET | Freq: Two times a day (BID) | ORAL | 0 refills | Status: AC | PRN

## 2018-08-02 MED ORDER — ONDANSETRON HCL 4 MG PO TABS: 4.0000 mg | ORAL_TABLET | Freq: Three times a day (TID) | ORAL | 0 refills | Status: AC | PRN

## 2018-08-02 MED ORDER — CIPROFLOXACIN HCL 500 MG PO TABS: 500.0000 mg | ORAL_TABLET | Freq: Two times a day (BID) | ORAL | 0 refills | Status: AC

## 2018-08-02 NOTE — H&P (View-Only) (Signed)
Patient ID: Kaitlin Patterson, female    DOB: 01/16/1975, 43 y.o.   MRN: [id number]   Chief Complaint  Patient presents with  . Breast Problem  . Breast Cancer    The patient is a 43 year old female here for her preoperative H&P for breast reconstruction.  She is a shared patient with Dr. Lilia Pro.  She had negative genetics.  She had originally made an appointment for reduction when she had her mammogram and was found to have some abnormalities.  MRI and biopsy showed positive right-sided breast cancer.  Her preop bra size is a 32G she would like to be a B or a C cup.  She is 5 feet 9 inches tall and weighs 139 pounds.  She has significant ptosis and therefore is aware that she will not have salvage of the nipple areole up.  She has not had any recent illnesses.   Review of Systems  Constitutional: Negative.  Negative for activity change and appetite change.  HENT: Negative.   Eyes: Negative.   Respiratory: Negative.  Negative for chest tightness and shortness of breath.   Cardiovascular: Negative for leg swelling.  Gastrointestinal: Negative for abdominal pain.  Endocrine: Negative.   Genitourinary: Negative.   Musculoskeletal: Negative.   Neurological: Negative.   Hematological: Negative.   Psychiatric/Behavioral: Negative.     Past Medical History:  Diagnosis Date  . Allergic rhinitis     Past Surgical History:  Procedure Laterality Date  . CESAREAN SECTION N/A 2016   cesarean done when patient had twins  . HYSTEROTOMY Bilateral 11/29/2016   Partial all taken but overies.       Current Outpatient Medications:  .  Biotin 10 MG CAPS, , Disp: , Rfl:  .  diphenhydrAMINE (BENADRYL) 25 MG tablet, Take 25 mg by mouth daily., Disp: , Rfl:  .  levothyroxine (SYNTHROID) 75 MCG tablet, TAKE 1 TABLET (75 MCG TOTAL) BY MOUTH DAILY BEFORE BREAKFAST., Disp: 90 tablet, Rfl: 3   Objective:   Vitals:   08/02/18 1204  Resp: 18  Temp: 98.2 F (36.8 C)  SpO2: 100%    Physical Exam  Vitals signs and nursing note reviewed.  Constitutional:      Appearance: Normal appearance.  HENT:     Head: Normocephalic and atraumatic.     Nose: Nose normal.  Eyes:     Extraocular Movements: Extraocular movements intact.  Cardiovascular:     Rate and Rhythm: Normal rate.     Pulses: Normal pulses.  Pulmonary:     Effort: Pulmonary effort is normal.  Abdominal:     General: Abdomen is flat. There is no distension.     Tenderness: There is no abdominal tenderness.  Neurological:     General: No focal deficit present.     Mental Status: She is alert and oriented to person, place, and time.  Psychiatric:        Mood and Affect: Mood normal.        Behavior: Behavior normal.        Thought Content: Thought content normal.        Judgment: Judgment normal.     Assessment & Plan:     ICD-10-CM   1. Malignant neoplasm of right female breast, unspecified estrogen receptor status, unspecified site of breast ([place])  C50.911     Prescriptions were sent into the pharmacy.  We are planning on bilateral breast reconstruction with expander and Flex HD placement. The risks that can be encountered with  and after placement of a breast expander placement were discussed and include the following but not limited to these: bleeding, infection, delayed healing, anesthesia risks, skin sensation changes, injury to structures including nerves, blood vessels, and muscles which may be temporary or permanent, allergies to tape, suture materials and glues, blood products, topical preparations or injected agents, skin contour irregularities, skin discoloration and swelling, deep vein thrombosis, cardiac and pulmonary complications, pain, which may persist, fluid accumulation, wrinkling of the skin over the expander, changes in nipple or breast sensation, expander leakage or rupture, faulty position of the expander, persistent pain, formation of tight scar tissue around the expander (capsular contracture),  possible need for revisional surgery or staged procedures.   [place], DO

## 2018-08-02 NOTE — Progress Notes (Signed)
Patient ID: Kaitlin Patterson, female    DOB: Aug 14, 1975, 43 y.o.   MRN: [id number]   Chief Complaint  Patient presents with  . Breast Problem  . Breast Cancer    The patient is a 43 year old female here for her preoperative H&P for breast reconstruction.  She is a shared patient with Dr. Lilia Pro.  She had negative genetics.  She had originally made an appointment for reduction when she had her mammogram and was found to have some abnormalities.  MRI and biopsy showed positive right-sided breast cancer.  Her preop bra size is a 32G she would like to be a B or a C cup.  She is 5 feet 9 inches tall and weighs 139 pounds.  She has significant ptosis and therefore is aware that she will not have salvage of the nipple areole up.  She has not had any recent illnesses.   Review of Systems  Constitutional: Negative.  Negative for activity change and appetite change.  HENT: Negative.   Eyes: Negative.   Respiratory: Negative.  Negative for chest tightness and shortness of breath.   Cardiovascular: Negative for leg swelling.  Gastrointestinal: Negative for abdominal pain.  Endocrine: Negative.   Genitourinary: Negative.   Musculoskeletal: Negative.   Neurological: Negative.   Hematological: Negative.   Psychiatric/Behavioral: Negative.     Past Medical History:  Diagnosis Date  . Allergic rhinitis     Past Surgical History:  Procedure Laterality Date  . CESAREAN SECTION N/A 2016   cesarean done when patient had twins  . HYSTEROTOMY Bilateral 11/29/2016   Partial all taken but overies.       Current Outpatient Medications:  .  Biotin 10 MG CAPS, , Disp: , Rfl:  .  diphenhydrAMINE (BENADRYL) 25 MG tablet, Take 25 mg by mouth daily., Disp: , Rfl:  .  levothyroxine (SYNTHROID) 75 MCG tablet, TAKE 1 TABLET (75 MCG TOTAL) BY MOUTH DAILY BEFORE BREAKFAST., Disp: 90 tablet, Rfl: 3   Objective:   Vitals:   08/02/18 1204  Resp: 18  Temp: 98.2 F (36.8 C)  SpO2: 100%    Physical Exam  Vitals signs and nursing note reviewed.  Constitutional:      Appearance: Normal appearance.  HENT:     Head: Normocephalic and atraumatic.     Nose: Nose normal.  Eyes:     Extraocular Movements: Extraocular movements intact.  Cardiovascular:     Rate and Rhythm: Normal rate.     Pulses: Normal pulses.  Pulmonary:     Effort: Pulmonary effort is normal.  Abdominal:     General: Abdomen is flat. There is no distension.     Tenderness: There is no abdominal tenderness.  Neurological:     General: No focal deficit present.     Mental Status: She is alert and oriented to person, place, and time.  Psychiatric:        Mood and Affect: Mood normal.        Behavior: Behavior normal.        Thought Content: Thought content normal.        Judgment: Judgment normal.     Assessment & Plan:     ICD-10-CM   1. Malignant neoplasm of right female breast, unspecified estrogen receptor status, unspecified site of breast ([place])  C50.911     Prescriptions were sent into the pharmacy.  We are planning on bilateral breast reconstruction with expander and Flex HD placement. The risks that can be encountered with  and after placement of a breast expander placement were discussed and include the following but not limited to these: bleeding, infection, delayed healing, anesthesia risks, skin sensation changes, injury to structures including nerves, blood vessels, and muscles which may be temporary or permanent, allergies to tape, suture materials and glues, blood products, topical preparations or injected agents, skin contour irregularities, skin discoloration and swelling, deep vein thrombosis, cardiac and pulmonary complications, pain, which may persist, fluid accumulation, wrinkling of the skin over the expander, changes in nipple or breast sensation, expander leakage or rupture, faulty position of the expander, persistent pain, formation of tight scar tissue around the expander (capsular contracture),  possible need for revisional surgery or staged procedures.   [place], DO

## 2018-08-08 NOTE — Progress Notes (Signed)
[id number] - [place], [address] [place] [address] Phone: [phone] Fax: [phone]    Your procedure is scheduled on Monday, August 10th.  Report to [facility] Main Entrance "A" at 5:30 A.M., and check in at the Admitting office.  Call this number if you have problems the morning of surgery:  [phone]  Call [phone] if you have any questions prior to your surgery date Monday-Friday 8am-4pm   Remember:  Do not eat or drink after midnight the night before your surgery   Take these medicines the morning of surgery with A SIP OF WATER  levothyroxine (SYNTHROID)   If needed - diazepam (VALIUM), HYDROcodone-acetaminophen (NORCO),   7 days prior to surgery STOP taking any Aspirin (unless otherwise instructed by your surgeon), Aleve, Naproxen, Ibuprofen, Motrin, Advil, Goody's, BC's, all herbal medications, fish oil, and all vitamins.   The Morning of Surgery  Do not wear jewelry, make-up or nail polish.  Do not wear lotions, powders, or perfumes/colognes, or deodorant  Do not shave 48 hours prior to surgery.    Do not bring valuables to the hospital.  [facility] is not responsible for any belongings or valuables.  If you are a smoker, DO NOT Smoke 24 hours prior to surgery IF you wear a CPAP at night please bring your mask, tubing, and machine the morning of surgery   Remember that you must have someone to transport you home after your surgery, and remain with you for 24 hours if you are discharged the same day.  Contacts, glasses, hearing aids, dentures or bridgework may not be worn into surgery.   Leave your suitcase in the car.  After surgery it may be brought to your room.  For patients admitted to the hospital, discharge time will be determined by your treatment team.  Patients discharged the day of surgery will not be allowed to drive home.   Special instructions:   [place]- Preparing For  Surgery  Before surgery, you can play an important role. Because skin is not sterile, your skin needs to be as free of germs as possible. You can reduce the number of germs on your skin by washing with CHG (chlorahexidine gluconate) Soap before surgery.  CHG is an antiseptic cleaner which kills germs and bonds with the skin to continue killing germs even after washing.    Oral Hygiene is also important to reduce your risk of infection.  Remember - BRUSH YOUR TEETH THE MORNING OF SURGERY WITH YOUR REGULAR TOOTHPASTE  Please do not use if you have an allergy to CHG or antibacterial soaps. If your skin becomes reddened/irritated stop using the CHG.  Do not shave (including legs and underarms) for at least 48 hours prior to first CHG shower. It is OK to shave your face.  Please follow these instructions carefully.   1. Shower the NIGHT BEFORE SURGERY and the MORNING OF SURGERY with CHG Soap.   2. If you chose to wash your hair, wash your hair first as usual with your normal shampoo.  3. After you shampoo, rinse your hair and body thoroughly to remove the shampoo.  4. Use CHG as you would any other liquid soap. You can apply CHG directly to the skin and wash gently with a scrungie or a clean washcloth.   5. Apply the CHG Soap to your body ONLY FROM THE NECK DOWN.  Do not use on open wounds or open  sores. Avoid contact with your eyes, ears, mouth and genitals (private parts). Wash Face and genitals (private parts)  with your normal soap.   6. Wash thoroughly, paying special attention to the area where your surgery will be performed.  7. Thoroughly rinse your body with warm water from the neck down.  8. DO NOT shower/wash with your normal soap after using and rinsing off the CHG Soap.  9. Pat yourself dry with a CLEAN TOWEL.  10. Wear CLEAN PAJAMAS to bed the night before surgery, wear comfortable clothes the morning of surgery  11. Place CLEAN SHEETS on your bed the night of your first  shower and DO NOT SLEEP WITH PETS.  Day of Surgery:  Do not apply any deodorants/lotions. Please shower the morning of surgery with the CHG soap  Please wear clean clothes to the hospital/surgery center.   Remember to brush your teeth WITH YOUR REGULAR TOOTHPASTE.  Please read over the following fact sheets that you were given.

## 2018-08-09 ENCOUNTER — Other Ambulatory Visit: Payer: Self-pay

## 2018-08-09 ENCOUNTER — Encounter (HOSPITAL_COMMUNITY): Payer: Self-pay

## 2018-08-09 ENCOUNTER — Encounter (HOSPITAL_COMMUNITY)
Admission: RE | Admit: 2018-08-09 | Discharge: 2018-08-09 | Disposition: A | Discharge status: Home / Self Care | Payer: BC Managed Care – PPO | Source: Ambulatory Visit | Attending: Plastic Surgery | Admitting: Plastic Surgery

## 2018-08-09 ENCOUNTER — Other Ambulatory Visit (HOSPITAL_COMMUNITY)
Admission: RE | Admit: 2018-08-09 | Discharge: 2018-08-09 | Disposition: A | Discharge status: Home / Self Care | Payer: BC Managed Care – PPO | Source: Ambulatory Visit | Attending: Plastic Surgery | Admitting: Plastic Surgery

## 2018-08-09 DIAGNOSIS — Z01812 Encounter for preprocedural laboratory examination: Secondary | ICD-10-CM | POA: Insufficient documentation

## 2018-08-09 DIAGNOSIS — Z20828 Contact with and (suspected) exposure to other viral communicable diseases: Secondary | ICD-10-CM | POA: Diagnosis not present

## 2018-08-09 HISTORY — DX: Nausea with vomiting, unspecified: R11.2

## 2018-08-09 HISTORY — DX: Other specified postprocedural states: Z98.890

## 2018-08-09 HISTORY — DX: Hypothyroidism, unspecified: E03.9

## 2018-08-09 HISTORY — DX: Malignant (primary) neoplasm, unspecified: C80.1

## 2018-08-09 LAB — CBC
HCT: 37.8 % (ref 36.0–46.0)
Hemoglobin: 13.4 g/dL (ref 12.0–15.0)
MCH: 32.1 pg (ref 26.0–34.0)
MCHC: 35.4 g/dL (ref 30.0–36.0)
MCV: 90.6 fL (ref 80.0–100.0)
Platelets: 357 10*3/uL (ref 150–400)
RBC: 4.17 MIL/uL (ref 3.87–5.11)
RDW: 13.3 % (ref 11.5–15.5)
WBC: 4.9 10*3/uL (ref 4.0–10.5)
nRBC: 0 % (ref 0.0–0.2)

## 2018-08-09 LAB — SARS CORONAVIRUS 2 (TAT 6-24 HRS): SARS Coronavirus 2: NEGATIVE

## 2018-08-09 NOTE — Progress Notes (Signed)
PCP - denies Cardiologist -denies   Chest x-ray - N/A EKG - N/A Stress Test -denies  ECHO - denies Cardiac Cath - denies  Sleep Study - denies CPAP - denies  Blood Thinner Instructions:N/A Aspirin Instructions:N/A  Anesthesia review: No  Patient denies shortness of breath, fever, cough and chest pain at PAT appointment   Patient verbalized understanding of instructions that were given to them at the PAT appointment. Patient was also instructed that they will need to review over the PAT instructions again at home before surgery.   Pt tested for COVID prior to PAT appointment.    Coronavirus Screening  Have you experienced the following symptoms:  Cough yes/no: No Fever (>100.17F)  yes/no: No Runny nose yes/no: No Sore throat yes/no: No Difficulty breathing/shortness of breath  yes/no: No  Have you or a family member traveled in the last 14 days and where? yes/no: No   If the patient indicates "YES" to the above questions, their PAT will be rescheduled to limit the exposure to others and, the surgeon will be notified. THE PATIENT WILL NEED TO BE ASYMPTOMATIC FOR 14 DAYS.   If the patient is not experiencing any of these symptoms, the PAT nurse will instruct them to NOT bring anyone with them to their appointment since they may have these symptoms or traveled as well.   Please remind your patients and families that hospital visitation restrictions are in effect and the importance of the restrictions.

## 2018-08-13 ENCOUNTER — Ambulatory Visit (HOSPITAL_COMMUNITY): Payer: BC Managed Care – PPO | Admitting: Anesthesiology

## 2018-08-13 ENCOUNTER — Encounter (HOSPITAL_COMMUNITY): Payer: Self-pay | Admitting: Certified Registered"

## 2018-08-13 ENCOUNTER — Observation Stay (HOSPITAL_COMMUNITY)
Admission: RE | Admit: 2018-08-13 | Discharge: 2018-08-14 | Disposition: A | Discharge status: Home / Self Care | Payer: BC Managed Care – PPO | Attending: Plastic Surgery | Admitting: Plastic Surgery

## 2018-08-13 ENCOUNTER — Encounter (HOSPITAL_COMMUNITY)
Admission: RE | Disposition: A | Discharge status: Home / Self Care | Payer: Self-pay | Source: Home / Self Care | Attending: Plastic Surgery

## 2018-08-13 DIAGNOSIS — I959 Hypotension, unspecified: Secondary | ICD-10-CM | POA: Diagnosis not present

## 2018-08-13 DIAGNOSIS — C50919 Malignant neoplasm of unspecified site of unspecified female breast: Secondary | ICD-10-CM | POA: Diagnosis present

## 2018-08-13 DIAGNOSIS — N6022 Fibroadenosis of left breast: Secondary | ICD-10-CM | POA: Diagnosis not present

## 2018-08-13 DIAGNOSIS — D0511 Intraductal carcinoma in situ of right breast: Principal | ICD-10-CM | POA: Insufficient documentation

## 2018-08-13 DIAGNOSIS — R928 Other abnormal and inconclusive findings on diagnostic imaging of breast: Secondary | ICD-10-CM | POA: Diagnosis not present

## 2018-08-13 DIAGNOSIS — N6011 Diffuse cystic mastopathy of right breast: Secondary | ICD-10-CM | POA: Diagnosis not present

## 2018-08-13 DIAGNOSIS — C50911 Malignant neoplasm of unspecified site of right female breast: Secondary | ICD-10-CM | POA: Diagnosis not present

## 2018-08-13 DIAGNOSIS — E039 Hypothyroidism, unspecified: Secondary | ICD-10-CM | POA: Insufficient documentation

## 2018-08-13 DIAGNOSIS — Z79890 Hormone replacement therapy (postmenopausal): Secondary | ICD-10-CM | POA: Insufficient documentation

## 2018-08-13 DIAGNOSIS — C50912 Malignant neoplasm of unspecified site of left female breast: Secondary | ICD-10-CM | POA: Diagnosis not present

## 2018-08-13 DIAGNOSIS — G8918 Other acute postprocedural pain: Secondary | ICD-10-CM | POA: Diagnosis not present

## 2018-08-13 DIAGNOSIS — R222 Localized swelling, mass and lump, trunk: Secondary | ICD-10-CM | POA: Diagnosis not present

## 2018-08-13 DIAGNOSIS — N6012 Diffuse cystic mastopathy of left breast: Secondary | ICD-10-CM | POA: Diagnosis not present

## 2018-08-13 DIAGNOSIS — D1721 Benign lipomatous neoplasm of skin and subcutaneous tissue of right arm: Secondary | ICD-10-CM | POA: Diagnosis not present

## 2018-08-13 HISTORY — PX: SIMPLE MASTECTOMY: SHX2409

## 2018-08-13 HISTORY — PX: SIMPLE MASTECTOMY WITH AXILLARY SENTINEL NODE BIOPSY: SHX6098

## 2018-08-13 HISTORY — PX: BREAST RECONSTRUCTION WITH PLACEMENT OF TISSUE EXPANDER AND FLEX HD (ACELLULAR HYDRATED DERMIS): SHX6295

## 2018-08-13 SURGERY — BREAST RECONSTRUCTION WITH PLACEMENT OF TISSUE EXPANDER AND FLEX HD (ACELLULAR HYDRATED DERMIS)
Anesthesia: Regional | Site: Breast | Laterality: Bilateral

## 2018-08-13 MED ORDER — PROPOFOL 10 MG/ML IV BOLUS
INTRAVENOUS | Status: AC
  Filled 2018-08-13: qty 40

## 2018-08-13 MED ORDER — LACTATED RINGERS IV SOLN
INTRAVENOUS | Status: DC | PRN
  Administered 2018-08-13 (×2): via INTRAVENOUS

## 2018-08-13 MED ORDER — FENTANYL CITRATE (PF) 100 MCG/2ML IJ SOLN
25.0000 ug | INTRAMUSCULAR | Status: DC | PRN
  Administered 2018-08-13: 50 ug via INTRAVENOUS
  Administered 2018-08-13: 16:00:00 25 ug via INTRAVENOUS

## 2018-08-13 MED ORDER — DIAZEPAM 2 MG PO TABS
2.0000 mg | ORAL_TABLET | Freq: Two times a day (BID) | ORAL | Status: DC | PRN
  Administered 2018-08-13 – 2018-08-14 (×2): 2 mg via ORAL
  Filled 2018-08-13 (×2): qty 1

## 2018-08-13 MED ORDER — BUPIVACAINE HCL (PF) 0.25 % IJ SOLN
INTRAMUSCULAR | Status: DC | PRN
  Administered 2018-08-13 (×2): 15 mL

## 2018-08-13 MED ORDER — SUGAMMADEX SODIUM 200 MG/2ML IV SOLN
INTRAVENOUS | Status: DC | PRN
  Administered 2018-08-13: 120 mg via INTRAVENOUS

## 2018-08-13 MED ORDER — HEMOSTATIC AGENTS (NO CHARGE) OPTIME
TOPICAL | Status: DC | PRN
  Administered 2018-08-13: 1 via TOPICAL

## 2018-08-13 MED ORDER — SUCCINYLCHOLINE CHLORIDE 200 MG/10ML IV SOSY
PREFILLED_SYRINGE | INTRAVENOUS | Status: AC
  Filled 2018-08-13: qty 10

## 2018-08-13 MED ORDER — PROPOFOL 500 MG/50ML IV EMUL
INTRAVENOUS | Status: DC | PRN
  Administered 2018-08-13: 14:00:00 via INTRAVENOUS
  Administered 2018-08-13: 25 ug/kg/min via INTRAVENOUS

## 2018-08-13 MED ORDER — ROCURONIUM BROMIDE 10 MG/ML (PF) SYRINGE
PREFILLED_SYRINGE | INTRAVENOUS | Status: AC
  Filled 2018-08-13: qty 10

## 2018-08-13 MED ORDER — SODIUM CHLORIDE 0.9 % IV SOLN
INTRAVENOUS | Status: AC
  Filled 2018-08-13: qty 500000

## 2018-08-13 MED ORDER — FENTANYL CITRATE (PF) 100 MCG/2ML IJ SOLN
INTRAMUSCULAR | Status: AC
  Administered 2018-08-13: 100 ug
  Filled 2018-08-13: qty 2

## 2018-08-13 MED ORDER — CHLORHEXIDINE GLUCONATE CLOTH 2 % EX PADS: 6.0000 | MEDICATED_PAD | Freq: Once | CUTANEOUS | Status: DC

## 2018-08-13 MED ORDER — ONDANSETRON HCL 4 MG/2ML IJ SOLN
4.0000 mg | Freq: Four times a day (QID) | INTRAMUSCULAR | Status: DC | PRN
  Administered 2018-08-13: 4 mg via INTRAVENOUS
  Filled 2018-08-13: qty 2

## 2018-08-13 MED ORDER — FENTANYL CITRATE (PF) 250 MCG/5ML IJ SOLN
INTRAMUSCULAR | Status: AC
  Filled 2018-08-13: qty 5

## 2018-08-13 MED ORDER — NAPROXEN 250 MG PO TABS
500.0000 mg | ORAL_TABLET | Freq: Two times a day (BID) | ORAL | Status: DC | PRN
  Administered 2018-08-13: 500 mg via ORAL
  Filled 2018-08-13: qty 2

## 2018-08-13 MED ORDER — LIDOCAINE 2% (20 MG/ML) 5 ML SYRINGE
INTRAMUSCULAR | Status: AC
  Filled 2018-08-13: qty 5

## 2018-08-13 MED ORDER — HYDROCODONE-ACETAMINOPHEN 5-325 MG PO TABS
1.0000 | ORAL_TABLET | ORAL | Status: DC | PRN
  Administered 2018-08-14: 1 via ORAL
  Filled 2018-08-13: qty 1

## 2018-08-13 MED ORDER — FENTANYL CITRATE (PF) 100 MCG/2ML IJ SOLN
INTRAMUSCULAR | Status: AC
  Filled 2018-08-13: qty 2

## 2018-08-13 MED ORDER — SODIUM CHLORIDE 0.9 % IV SOLN
INTRAVENOUS | Status: DC | PRN
  Administered 2018-08-13: 40 ug/min via INTRAVENOUS

## 2018-08-13 MED ORDER — ONDANSETRON 4 MG PO TBDP: 4.0000 mg | ORAL_TABLET | Freq: Four times a day (QID) | ORAL | Status: DC | PRN

## 2018-08-13 MED ORDER — ACETAMINOPHEN 500 MG PO TABS
1000.0000 mg | ORAL_TABLET | Freq: Once | ORAL | Status: AC
  Administered 2018-08-13: 10:00:00 1000 mg via ORAL
  Filled 2018-08-13: qty 2

## 2018-08-13 MED ORDER — MIDAZOLAM HCL 2 MG/2ML IJ SOLN
INTRAMUSCULAR | Status: AC
  Filled 2018-08-13: qty 2

## 2018-08-13 MED ORDER — DIPHENHYDRAMINE HCL 50 MG/ML IJ SOLN
INTRAMUSCULAR | Status: AC
  Filled 2018-08-13: qty 1

## 2018-08-13 MED ORDER — SCOPOLAMINE 1 MG/3DAYS TD PT72
1.0000 | MEDICATED_PATCH | TRANSDERMAL | Status: DC
  Administered 2018-08-13: 1.5 mg via TRANSDERMAL
  Filled 2018-08-13: qty 1

## 2018-08-13 MED ORDER — BUPIVACAINE HCL (PF) 0.25 % IJ SOLN: INTRAMUSCULAR | Status: DC | PRN

## 2018-08-13 MED ORDER — DIPHENHYDRAMINE HCL 50 MG/ML IJ SOLN
INTRAMUSCULAR | Status: DC | PRN
  Administered 2018-08-13: 25 mg via INTRAVENOUS

## 2018-08-13 MED ORDER — CELECOXIB 200 MG PO CAPS: 400.0000 mg | ORAL_CAPSULE | Freq: Once | ORAL | Status: DC

## 2018-08-13 MED ORDER — PHENYLEPHRINE 40 MCG/ML (10ML) SYRINGE FOR IV PUSH (FOR BLOOD PRESSURE SUPPORT)
PREFILLED_SYRINGE | INTRAVENOUS | Status: AC
  Filled 2018-08-13: qty 10

## 2018-08-13 MED ORDER — MORPHINE SULFATE (PF) 2 MG/ML IV SOLN: 2.0000 mg | INTRAVENOUS | Status: DC | PRN

## 2018-08-13 MED ORDER — ONDANSETRON HCL 4 MG/2ML IJ SOLN
INTRAMUSCULAR | Status: DC | PRN
  Administered 2018-08-13: 4 mg via INTRAVENOUS

## 2018-08-13 MED ORDER — PROPOFOL 10 MG/ML IV BOLUS
INTRAVENOUS | Status: DC | PRN
  Administered 2018-08-13: 170 mg via INTRAVENOUS

## 2018-08-13 MED ORDER — ACETAMINOPHEN 325 MG PO TABS
325.0000 mg | ORAL_TABLET | Freq: Four times a day (QID) | ORAL | Status: DC
  Administered 2018-08-13 – 2018-08-14 (×4): 325 mg via ORAL
  Filled 2018-08-13 (×4): qty 1

## 2018-08-13 MED ORDER — SENNA 8.6 MG PO TABS
1.0000 | ORAL_TABLET | Freq: Two times a day (BID) | ORAL | Status: DC
  Administered 2018-08-13 – 2018-08-14 (×2): 8.6 mg via ORAL
  Filled 2018-08-13 (×2): qty 1

## 2018-08-13 MED ORDER — DIPHENHYDRAMINE HCL 12.5 MG/5ML PO ELIX
12.5000 mg | ORAL_SOLUTION | Freq: Four times a day (QID) | ORAL | Status: DC | PRN
  Administered 2018-08-13: 12.5 mg via ORAL
  Filled 2018-08-13: qty 10

## 2018-08-13 MED ORDER — ONDANSETRON HCL 4 MG/2ML IJ SOLN
INTRAMUSCULAR | Status: AC
  Filled 2018-08-13: qty 2

## 2018-08-13 MED ORDER — PROPOFOL 10 MG/ML IV BOLUS
INTRAVENOUS | Status: AC
  Filled 2018-08-13: qty 20

## 2018-08-13 MED ORDER — DEXAMETHASONE SODIUM PHOSPHATE 10 MG/ML IJ SOLN
INTRAMUSCULAR | Status: AC
  Filled 2018-08-13: qty 1

## 2018-08-13 MED ORDER — MIDAZOLAM HCL 2 MG/2ML IJ SOLN
INTRAMUSCULAR | Status: AC
  Administered 2018-08-13: 2 mg
  Filled 2018-08-13: qty 2

## 2018-08-13 MED ORDER — MIDAZOLAM HCL 5 MG/5ML IJ SOLN
INTRAMUSCULAR | Status: DC | PRN
  Administered 2018-08-13: 2 mg via INTRAVENOUS

## 2018-08-13 MED ORDER — 0.9 % SODIUM CHLORIDE (POUR BTL) OPTIME
TOPICAL | Status: DC | PRN
  Administered 2018-08-13: 2000 mL

## 2018-08-13 MED ORDER — KCL IN DEXTROSE-NACL 20-5-0.45 MEQ/L-%-% IV SOLN
INTRAVENOUS | Status: DC
  Administered 2018-08-13: 18:00:00 via INTRAVENOUS
  Filled 2018-08-13 (×3): qty 1000

## 2018-08-13 MED ORDER — BUPIVACAINE LIPOSOME 1.3 % IJ SUSP
INTRAMUSCULAR | Status: DC | PRN
  Administered 2018-08-13 (×2): 10 mL

## 2018-08-13 MED ORDER — ROCURONIUM BROMIDE 50 MG/5ML IV SOSY
PREFILLED_SYRINGE | INTRAVENOUS | Status: DC | PRN
  Administered 2018-08-13: 70 mg via INTRAVENOUS
  Administered 2018-08-13: 20 mg via INTRAVENOUS
  Administered 2018-08-13: 10 mg via INTRAVENOUS
  Administered 2018-08-13: 20 mg via INTRAVENOUS
  Administered 2018-08-13: 30 mg via INTRAVENOUS

## 2018-08-13 MED ORDER — PHENYLEPHRINE 40 MCG/ML (10ML) SYRINGE FOR IV PUSH (FOR BLOOD PRESSURE SUPPORT)
PREFILLED_SYRINGE | INTRAVENOUS | Status: DC | PRN
  Administered 2018-08-13 (×3): 80 ug via INTRAVENOUS

## 2018-08-13 MED ORDER — DIPHENHYDRAMINE HCL 50 MG/ML IJ SOLN: 12.5000 mg | Freq: Four times a day (QID) | INTRAMUSCULAR | Status: DC | PRN

## 2018-08-13 MED ORDER — DEXAMETHASONE SODIUM PHOSPHATE 10 MG/ML IJ SOLN
INTRAMUSCULAR | Status: DC | PRN
  Administered 2018-08-13: 8 mg via INTRAVENOUS

## 2018-08-13 MED ORDER — CIPROFLOXACIN IN D5W 400 MG/200ML IV SOLN
400.0000 mg | Freq: Two times a day (BID) | INTRAVENOUS | Status: DC
  Administered 2018-08-13 – 2018-08-14 (×2): 400 mg via INTRAVENOUS
  Filled 2018-08-13 (×2): qty 200

## 2018-08-13 MED ORDER — FENTANYL CITRATE (PF) 100 MCG/2ML IJ SOLN
INTRAMUSCULAR | Status: DC | PRN
  Administered 2018-08-13 (×3): 50 ug via INTRAVENOUS
  Administered 2018-08-13: 100 ug via INTRAVENOUS

## 2018-08-13 MED ORDER — LACTATED RINGERS IV SOLN
INTRAVENOUS | Status: DC
  Administered 2018-08-13: 10:00:00 via INTRAVENOUS

## 2018-08-13 MED ORDER — CIPROFLOXACIN IN D5W 400 MG/200ML IV SOLN
400.0000 mg | INTRAVENOUS | Status: AC
  Administered 2018-08-13: 400 mg via INTRAVENOUS
  Filled 2018-08-13: qty 200

## 2018-08-13 MED ORDER — SODIUM CHLORIDE 0.9 % IV SOLN
INTRAVENOUS | Status: DC | PRN
  Administered 2018-08-13: 500 mL

## 2018-08-13 MED ORDER — PROMETHAZINE HCL 25 MG/ML IJ SOLN: 6.2500 mg | INTRAMUSCULAR | Status: DC | PRN

## 2018-08-13 SURGICAL SUPPLY — 69 items
APPLIER CLIP 9.375 MED OPEN (MISCELLANEOUS) ×2
BAG DECANTER FOR FLEXI CONT (MISCELLANEOUS) ×2 IMPLANT
BINDER BREAST LRG (GAUZE/BANDAGES/DRESSINGS) ×2 IMPLANT
BINDER BREAST XLRG (GAUZE/BANDAGES/DRESSINGS) IMPLANT
BIOPATCH RED 1 DISK 7.0 (GAUZE/BANDAGES/DRESSINGS) ×4 IMPLANT
CANISTER SUCT 3000ML PPV (MISCELLANEOUS) ×4 IMPLANT
CHLORAPREP W/TINT 26 (MISCELLANEOUS) ×4 IMPLANT
CLIP APPLIE 9.375 MED OPEN (MISCELLANEOUS) ×1 IMPLANT
COVER SURGICAL LIGHT HANDLE (MISCELLANEOUS) ×4 IMPLANT
COVER WAND RF STERILE (DRAPES) IMPLANT
DERMABOND ADVANCED (GAUZE/BANDAGES/DRESSINGS) ×3
DERMABOND ADVANCED .7 DNX12 (GAUZE/BANDAGES/DRESSINGS) ×3 IMPLANT
DEVICE DISSECT PLASMABLAD 3.0S (MISCELLANEOUS) IMPLANT
DRAIN CHANNEL 19F RND (DRAIN) ×8 IMPLANT
DRAPE HALF SHEET 40X57 (DRAPES) ×4 IMPLANT
DRAPE LAPAROSCOPIC ABDOMINAL (DRAPES) IMPLANT
DRAPE ORTHO SPLIT 77X108 STRL (DRAPES) ×2
DRAPE SURG 17X23 STRL (DRAPES) IMPLANT
DRAPE SURG ORHT 6 SPLT 77X108 (DRAPES) ×2 IMPLANT
DRAPE WARM FLUID 44X44 (DRAPES) ×2 IMPLANT
DRSG PAD ABDOMINAL 8X10 ST (GAUZE/BANDAGES/DRESSINGS) ×8 IMPLANT
DRSG TEGADERM 2-3/8X2-3/4 SM (GAUZE/BANDAGES/DRESSINGS) ×4 IMPLANT
ELECT BLADE 4.0 EZ CLEAN MEGAD (MISCELLANEOUS)
ELECT REM PT RETURN 9FT ADLT (ELECTROSURGICAL) ×4
ELECTRODE BLDE 4.0 EZ CLN MEGD (MISCELLANEOUS) IMPLANT
ELECTRODE REM PT RTRN 9FT ADLT (ELECTROSURGICAL) ×2 IMPLANT
EVACUATOR SILICONE 100CC (DRAIN) ×8 IMPLANT
FILTER IN LINE W/DETACHED HOSE (FILTER) ×2 IMPLANT
GAUZE SPONGE 4X4 12PLY STRL (GAUZE/BANDAGES/DRESSINGS) ×4 IMPLANT
GLOVE BIO SURGEON STRL SZ 6 (GLOVE) ×6 IMPLANT
GLOVE BIO SURGEON STRL SZ 6.5 (GLOVE) ×4 IMPLANT
GLOVE BIO SURGEON STRL SZ7 (GLOVE) ×4 IMPLANT
GLOVE BIO SURGEON STRL SZ7.5 (GLOVE) ×2 IMPLANT
GLOVE BIOGEL PI IND STRL 7.5 (GLOVE) ×1 IMPLANT
GLOVE BIOGEL PI INDICATOR 7.5 (GLOVE) ×1
GOWN STRL REUS W/ TWL LRG LVL3 (GOWN DISPOSABLE) ×8 IMPLANT
GOWN STRL REUS W/TWL LRG LVL3 (GOWN DISPOSABLE) ×8
GRAFT FLEX HD 6X16 PLIABLE (Tissue) ×4 IMPLANT
IMPL EXPANDER BREAST 535CC (Breast) ×2 IMPLANT
IMPLANT BREAST 535CC (Breast) ×2 IMPLANT
IMPLANT EXPANDER BREAST 535CC (Breast) ×2 IMPLANT
KIT BASIN OR (CUSTOM PROCEDURE TRAY) ×4 IMPLANT
KIT TURNOVER KIT B (KITS) ×4 IMPLANT
LIGHT WAVEGUIDE WIDE FLAT (MISCELLANEOUS) IMPLANT
NS IRRIG 1000ML POUR BTL (IV SOLUTION) ×6 IMPLANT
PACK GENERAL/GYN (CUSTOM PROCEDURE TRAY) ×4 IMPLANT
PAD ABD 8X10 STRL (GAUZE/BANDAGES/DRESSINGS) ×8 IMPLANT
PAD ARMBOARD 7.5X6 YLW CONV (MISCELLANEOUS) ×4 IMPLANT
PENCIL SMOKE EVACUATOR (MISCELLANEOUS) ×2 IMPLANT
PIN SAFETY STERILE (MISCELLANEOUS) ×4 IMPLANT
PLASMABLADE 3.0S (MISCELLANEOUS)
SET ASEPTIC TRANSFER (MISCELLANEOUS) ×2 IMPLANT
SPECIMEN JAR X LARGE (MISCELLANEOUS) ×2 IMPLANT
SPONGE LAP 18X18 RF (DISPOSABLE) ×2 IMPLANT
SUT ETHILON 3 0 FSL (SUTURE) ×2 IMPLANT
SUT MNCRL AB 4-0 PS2 18 (SUTURE) ×16 IMPLANT
SUT MON AB 3-0 SH 27 (SUTURE) ×3
SUT MON AB 3-0 SH27 (SUTURE) ×3 IMPLANT
SUT MON AB 5-0 PS2 18 (SUTURE) ×12 IMPLANT
SUT PDS AB 2-0 CT1 27 (SUTURE) ×10 IMPLANT
SUT SILK 4 0 PS 2 (SUTURE) ×2 IMPLANT
SUT VIC AB 3-0 54X BRD REEL (SUTURE) ×1 IMPLANT
SUT VIC AB 3-0 BRD 54 (SUTURE) ×1
SUT VIC AB 3-0 SH 18 (SUTURE) ×4 IMPLANT
TAPE CLOTH SURG 4X10 WHT LF (GAUZE/BANDAGES/DRESSINGS) ×2 IMPLANT
TOWEL GREEN STERILE (TOWEL DISPOSABLE) ×4 IMPLANT
TOWEL GREEN STERILE FF (TOWEL DISPOSABLE) ×4 IMPLANT
TRAY FOLEY MTR SLVR 14FR STAT (SET/KITS/TRAYS/PACK) ×2 IMPLANT
TUBE CONNECTING 12X1/4 (SUCTIONS) IMPLANT

## 2018-08-13 NOTE — Anesthesia Procedure Notes (Signed)
Anesthesia Regional Block: Pectoralis block   Pre-Anesthetic Checklist: ,, timeout performed, Correct Patient, Correct Site, Correct Laterality, Correct Procedure, Correct Position, site marked, Risks and benefits discussed,  Surgical consent,  Pre-op evaluation,  At surgeon's request and post-op pain management  Laterality: Left  Prep: chloraprep       Needles:  Injection technique: Single-shot  Needle Type: Echogenic Stimulator Needle     Needle Length: 10cm  Needle Gauge: 21     Additional Needles:   Narrative:  Start time: 08/13/2018 10:01 AM End time: 08/13/2018 10:07 AM Injection made incrementally with aspirations every 5 mL.  Performed by: Personally

## 2018-08-13 NOTE — Progress Notes (Signed)
Received patient from PACU. Patient alert and oriented. Dressing clean, dry, and intact. Bilateral JP drains charged to suction. Patient oriented to call bell and bed controls. Instructed patient not to self ambulate and to utilize call bell for assistance. Will continue to monitor.

## 2018-08-13 NOTE — Transfer of Care (Signed)
Immediate Anesthesia Transfer of Care Note  Patient: Kaitlin Patterson  Procedure(s) Performed: BREAST RECONSTRUCTION WITH PLACEMENT OF TISSUE EXPANDER AND FLEX HD (ACELLULAR HYDRATED DERMIS) (Bilateral Breast) BILATERAL SIMPLE MASTECTOMY AND EXCISION OF RIGHT AXILLARY MASS. (Bilateral Breast)  Patient Location: PACU  Anesthesia Type:General  Level of Consciousness: awake and oriented  Airway & Oxygen Therapy: Patient Spontanous Breathing  Post-op Assessment: Report given to RN  Post vital signs: Reviewed and stable  Last Vitals:  Vitals Value Taken Time  BP    Temp    Pulse    Resp    SpO2      Last Pain:  Vitals:   08/13/18 0907  TempSrc: Oral         Complications: No apparent anesthesia complications

## 2018-08-13 NOTE — Anesthesia Procedure Notes (Addendum)
Procedure Name: Intubation Date/Time: 08/13/2018 10:55 AM Performed by: Orlie Dakin, CRNA Pre-anesthesia Checklist: Patient identified, Emergency Drugs available, Suction available and Patient being monitored Patient Re-evaluated:Patient Re-evaluated prior to induction Oxygen Delivery Method: Circle system utilized Preoxygenation: Pre-oxygenation with 100% oxygen Induction Type: IV induction Ventilation: Mask ventilation without difficulty Laryngoscope Size: Miller and 3 Grade View: Grade I Tube type: Oral Tube size: 7.0 mm Number of attempts: 1 Airway Equipment and Method: Stylet Placement Confirmation: ETT inserted through vocal cords under direct vision,  positive ETCO2 and breath sounds checked- equal and bilateral Secured at: 22 cm Tube secured with: Tape Dental Injury: Teeth and Oropharynx as per pre-operative assessment  Comments: 4x4s bite block used.

## 2018-08-13 NOTE — Anesthesia Procedure Notes (Signed)
Anesthesia Regional Block: Pectoralis block   Pre-Anesthetic Checklist: ,, timeout performed, Correct Patient, Correct Site, Correct Laterality, Correct Procedure, Correct Position, site marked, Risks and benefits discussed,  Surgical consent,  Pre-op evaluation,  At surgeon's request and post-op pain management  Laterality: Right  Prep: chloraprep       Needles:  Injection technique: Single-shot  Needle Type: Echogenic Stimulator Needle     Needle Length: 10cm  Needle Gauge: 21     Additional Needles:   Narrative:  Start time: 08/13/2018 10:08 AM End time: 08/13/2018 10:17 AM Injection made incrementally with aspirations every 5 mL.  Performed by: Personally

## 2018-08-13 NOTE — Op Note (Signed)
08/13/2018  12:56 PM  PATIENT:  Kaitlin Patterson  43 y.o. female  PRE-OPERATIVE DIAGNOSIS:  Malignant Neoplasm Of Right Female Breast, Unspecified Estrogen Receptor Status, Unspecified Site Of Breast, Right axillary mass  POST-OPERATIVE DIAGNOSIS:  Malignant Neoplasm Of Right Female Breast, Unspecified Estrogen Receptor Status, Unspecified Site Of Breast, Right axillary mass  PROCEDURE: Bilateral simple mastectomy, excision of right axillary mass  SURGEON:     Pollyann Samples, MD - Primary  PHYSICIAN ASSISTANT:   ASSISTANTS: none   ANESTHESIA:   general  EBL:  100 ml   BLOOD ADMINISTERED:none  DRAINS: none   LOCAL MEDICATIONS USED:  NONE  SPECIMEN:  Right and Left breasts, Right axillary mass  DISPOSITION OF SPECIMEN:  PATHOLOGY  COUNTS:  YES  TOURNIQUET: None  DICTATION: .Dragon Dictation  PLAN OF CARE: Admit for overnight observation  PATIENT DISPOSITION:  Remains in OR for plastic surgery procedure   Delay start of Pharmacological VTE agent (>24hrs) due to surgical blood loss or risk of bleeding: yes  Indications: 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  She has numerous other areas of calcifications and has had several stereotactic biopsies.  She is interested in mastectomy with reconstruction.  She also has a small axillary mass which has been previously biopsied and proven to be benign.  We chose to proceed with excision of this as well.  Description: The patient was met in the preoperative area in conjunction with Dr. Marla Roe.  Skin incision lines were marked and the axillary mass was identified and marked as well.  The patient was taken to the operating room and placed supine on the operating room table.  General anesthesia was induced.  The entire chest and axillary regions were prepped with ChloraPrep and draped with sterile linen.  An elliptical incision was made surrounding the left nipple  areolar complex as previously marked.  Superior and inferior skin flaps were developed using electrocautery.  The flaps were extremely thin as the breast tissue extended very near the skin.  The lower flap was edematous as well.  After the flaps were created down to the chest wall the breast was dissected off of the chest wall using electrocautery.  Hemostasis was assured.  Next attention was turned to the right side where a symmetrical incision was performed.  Flaps were developed as previously described and the breast was dissected from the chest wall using cautery.  Hemostasis was assured.  A small incision was made in the right axillary region over the small palpable axillary mass which was excised.  This is a well encapsulated 1 to 2 cm small mass.  Hemostasis was assured.  The procedure was turned over to Dr.Dillingham for reconstruction.

## 2018-08-13 NOTE — Discharge Instructions (Signed)
INSTRUCTIONS FOR AFTER BREAST SURGERY   You are getting ready to undergo breast surgery.  You will likely have some questions about what to expect following your operation.  The following information will help you and your family understand what to expect when you are discharged from the hospital.  Following these guidelines will help ensure a smooth recovery and reduce risks of complications.   Postoperative instructions include information on: diet, wound care, medications and physical activity.  AFTER SURGERY Expect to go home after the procedure.  In some cases, you may need to spend one night in the hospital for observation.  DIET Breast surgery does not require a specific diet.  However, I have to mention that the healthier you eat the better your body can start healing. It is important to increasing your protein intake.  This means limiting the foods with sugar and carbohydrates.  Focus on vegetables and some meat.  If you have any liposuction during your procedure be sure to drink water.  If your urine is bright yellow, then it is concentrated, and you need to drink more water.  As a general rule after surgery, you should have 8 ounces of water every hour while awake.  If you find you are persistently nauseated or unable to take in liquids let us know.  NO TOBACCO USE or EXPOSURE.  This will slow your healing process and increase the risk of a wound.  WOUND CARE You can shower five days after surgery.  Clean with baby wipes until the drain is removed.   If you have steri-strips / tape directly attached to your skin leave them in place. It is OK to get these wet.  No baths, pools or hot tubs for two weeks. We close your incision to leave the smallest and best-looking scar. No ointment or creams on your incisions until given the go ahead.  Especially not Neosporin (Too many skin reactions with this one).  A few weeks after surgery you can use Mederma and start massaging the scar. We ask you to  wear your binder or sports bra for the first 6 weeks around the clock, including while sleeping. This provides added comfort and helps reduce the fluid accumulation at the surgery site.  ACTIVITY No heavy lifting until cleared by the doctor.  This usually means no more than a half-gallon of milk.  It is OK to walk and climb stairs. In fact, moving your legs is very important to decrease your risk of a blood clot.  It will also help keep you from getting deconditioned.  Every 1 to 2 hours get up and walk for 5 minutes. This will help with a quicker recovery back to normal.  Let pain be your guide so you don't do too much.  NO, you cannot do the spring cleaning and don't plan on taking care of anyone else.  This is your time for TLC.  You will be more comfortable if you sleep and rest with your head elevated either with a few pillows under you or in a recliner.  No stomach sleeping for a few months.  WORK Everyone returns to work at different times. As a rough guide, most people take at least 1 - 2 weeks off prior to returning to work. If you need documentation for your job, bring the forms to your postoperative follow up visit.  DRIVING Arrange for someone to bring you home from the hospital.  You may be able to drive a few days after  surgery but not while taking any narcotics or valium.  BOWEL MOVEMENTS Constipation can occur after anesthesia and while taking pain medication.  It is important to stay ahead for your comfort.  We recommend taking Milk of Magnesia (2 tablespoons; twice a day) while taking the pain pills.  SEROMA This is fluid your body tried to put in the surgical site.  This is normal but if it creates tight skinny skin let us know.  It usually decreases in a few weeks.  WHEN TO CALL Call your surgeon's office if any of the following occur:  Fever 101 degrees F or greater  Excessive bleeding or fluid from the incision site.  Pain that increases over time without aid from the  medications  Redness, warmth, or pus draining from incision sites  Persistent nausea or inability to take in liquids  Severe misshapen area that underwent the operation.  Here are some resources:  1. Plastic surgery website: https://www.plasticsurgery.org/for-medical-professionals/education-and-resources/publications/breast-reconstruction-magazine 2. Breast Reconstruction Awareness Campaign:  HotelLives.co.nz 3. Plastic surgery Implant information:  https://www.plasticsurgery.org/patient-safety/breast-implant-safety [facility] Plastic Surgery Specialist  What is the benefit of having a drain?  During surgery your tissue layers are separated.  This raw surface stimulates your body to fill the space with serous fluid.  This is normal but you don't want that fluid to collect and prevent healing.  A fluid collection can also become infected.  The Jackson-Pratt (JP) drain is used to eliminate this collection of fluid and allow the tissue to heal together.    Jackson-Pratt (JP) bulb    How to care for your drainage and suction unit at home Your drainage catheter will be connected to a collection device. The vacuum caused when the device is compressed allows drainage to collect in the device.     Wash your hands with soap and water before and after touching the system.  Empty the JP drain every 12 hours once you get home from your procedure.  Record the fluid amount on the record sheet included.  Start with stripping the drain tube to push the clots or excess fluid to the bulb.  Do this by pinching the tube with one hand near your skin.  Then with the other hand squeeze the tubing and work it toward the bulb.  This should be done several times a day.  This may collapse the tube which will correct on its own.    Use a safety pin to attach your collection device to your clothing so there is no tension on the insertion site.    If you have drainage at the skin insertion site, you can  apply a gauze dressing and secure it with tape.  If the drain falls out, apply a gauze dressing over the drain insertion site and secure with tape.   To empty the collection device:    Release the stopper on the top of the collection unit (bulb).   Pour contents into a measuring container such as a plastic medicine cup.   Record the day and amount of drainage on the attached sheet.  This should be done at least twice a day.    To compress the Jackson-Pratt Bulb:   Release the stopper at the top of the bulb.  Squeeze the bulb tightly in your fist, squeezing air out of the bulb.   Replace the stopper while the bulb is compressed.   Be careful not to spill the contents when squeezing the bulb.  The drainage will start bright red and turn to  pink and then yellow with time.  IMPORTANT: If the bulb is not squeezed before adding the stopper it will not draw out the fluid.  Care for the JP drain site and your skin daily:   You may shower three days after surgery.  Secure the drain to a ribbon or cloth around your waist while showering so it does not pull out while showering.  Be sure your hands are cleaned with soap and water.  Use a clean wet cotton swab to clean the skin around the drain site.   Use another cotton swab to place Vaseline or antibiotic ointment on the skin around the drain.     Contact your physician if any of the following occur:   The fluid in the bulb becomes cloudy.  Your temperature is greater than 101.4.   The incision opens.  If you have drainage at the skin insertion site, you can apply a gauze dressing and secure it with tape.  If the drain falls out, apply a gauze dressing over the drain insertion site and secure with tape.   You will usually have more drainage when you are active than while you rest or are asleep. If the drainage increases significantly or is bloody call the physician                             Bring this record with you to  each office visit Date  Drainage Volume  Date   Drainage volume

## 2018-08-13 NOTE — Anesthesia Postprocedure Evaluation (Signed)
Anesthesia Post Note  Patient: Technical sales engineer  Procedure(s) Performed: BREAST RECONSTRUCTION WITH PLACEMENT OF TISSUE EXPANDER AND FLEX HD (ACELLULAR HYDRATED DERMIS) (Bilateral Breast) BILATERAL SIMPLE MASTECTOMY AND EXCISION OF RIGHT AXILLARY MASS. (Bilateral Breast)     Patient location during evaluation: PACU Anesthesia Type: Regional and General Level of consciousness: sedated Pain management: pain level controlled Vital Signs Assessment: post-procedure vital signs reviewed and stable Respiratory status: spontaneous breathing and respiratory function stable Cardiovascular status: stable Postop Assessment: no apparent nausea or vomiting Anesthetic complications: no    Last Vitals:  Vitals:   08/13/18 1653 08/13/18 1724  BP: 108/65 140/75  Pulse: 74 75  Resp:  15  Temp: 36.7 C 36.9 C  SpO2: 98% 100%    Last Pain:  Vitals:   08/13/18 1724  TempSrc: Oral  PainSc:                  Kaitlin Patterson

## 2018-08-13 NOTE — Op Note (Signed)
Op report    DATE OF OPERATION:  08/13/2018  LOCATION: [facility] Main Operating Room  SURGICAL DIVISION: Plastic Surgery  PREOPERATIVE DIAGNOSES:  1. Right Breast cancer.    POSTOPERATIVE DIAGNOSES:  1. Right Breast cancer.   PROCEDURE:  1. Bilateral immediate breast reconstruction with placement of Acellular Dermal Matrix and tissue expanders. 2. Excision of bilateral lower flap excess tissue 7 x 15 cm  SURGEON: Cecilee Rosner [employer], DO  ASSISTANT: Roetta Sessions, PA and [place], RNFA  ANESTHESIA:  General.   COMPLICATIONS: None.   IMPLANTS: Left - Mentor 535 cc. Ref #[id number]. 300 cc of injectable saline placed in the expander. Right - Mentor 535 cc. Ref #[id number].  300 cc of injectable saline placed in the expander. Acellular Dermal Matrix 6 x 16 cm two  INDICATIONS FOR PROCEDURE:  The patient, Kaitlin Patterson, is a 43 y.o. female born on [birth year], is here for  immediate first stage breast reconstruction with placement of bilateral tissue expander and Acellular dermal matrix. MRN: [id number]  CONSENT:  Informed consent was obtained directly from the patient. Risks, benefits and alternatives were fully discussed. Specific risks including but not limited to bleeding, infection, hematoma, seroma, scarring, pain, implant infection, implant extrusion, capsular contracture, asymmetry, wound healing problems, and need for further surgery were all discussed. The patient did have an ample opportunity to have her questions answered to her satisfaction.   DESCRIPTION OF PROCEDURE:  The patient was taken to the operating room by the general surgery team. SCDs were placed and IV antibiotics were given. The patient's chest was prepped and draped in a sterile fashion. A time out was performed and the implants to be used were identified.  Bilateral mastectomies were performed.  Once the general surgery team had completed their portion of the case the patient was rendered to the  plastic and reconstructive surgery team.  Left:  The pectoralis major muscle was lifted from the chest wall with release of the lateral edge and lateral inframammary fold.  The pocket was irrigated with antibiotic solution and hemostasis was achieved with electrocautery.  The ADM was then prepared according to the manufacture guidelines and slits placed to help with postoperative fluid management.  The ADM was then sutured to the inferior and lateral edge of the inframammary fold with 2-0 PDS starting with an interrupted stitch and then a running stitch.  The lateral portion was sutured to with interrupted sutures after the expander was placed.  The expander was prepared according to the manufacture guidelines, the air evacuated and then it was placed under the ADM and pectoralis major muscle.  The inferior and lateral tabs were used to secure the expander to the chest wall with 2-0 PDS.  The drain was placed at the inframammary fold over the ADM and secured to the skin with 3-0 Silk. There was a tremendous amount of excess flap on the lower portion.  This was excised to improve the contour and closure for an area of 7 x 15 cm.  The deep layers were closed with 3-0 Monocryl followed by 4-0 Monocryl.  The skin was closed with 5-0 Monocryl and then dermabond was applied.    Right:  The pectoralis major muscle was lifted from the chest wall with release of the lateral edge and lateral inframammary fold.  The pocket was irrigated with antibiotic solution and hemostasis was achieved with electrocautery.  The ADM was then prepared according to the manufacture guidelines and slits placed to help with postoperative fluid management.  The ADM was then sutured to the inferior and lateral edge of the inframammary fold with 2-0 PDS starting with an interrupted stitch and then a running stitch.  The lateral portion was sutured to with interrupted sutures after the expander was placed.  The expander was prepared according to  the manufacture guidelines, the air evacuated and then it was placed under the ADM and pectoralis major muscle.  The inferior and lateral tabs were used to secure the expander to the chest wall with 2-0 PDS.  The drain was placed at the inframammary fold over the ADM and secured to the skin with 3-0 Silk.  There was a tremendous amount of excess flap on the lower portion.  This was excised to improve the contour and closure for an area of 7 x 15 cm.  The deep layers were closed with 3-0 Monocryl followed by 4-0 Monocryl.  The skin was closed with 5-0 Monocryl and then dermabond was applied.  The ABDs and breast binder were placed.  The patient tolerated the procedure well and there were no complications.  The patient was allowed to wake from anesthesia and taken to the recovery room in satisfactory condition.  There was more bleeding then usual and this was controlled with the hemostasis and on the right medial lower portion with a fibrin snow.  The advanced practice practitioner (APP) assisted throughout the case.  The APP was essential in retraction and counter traction when needed to make the case progress smoothly.  This retraction and assistance made it possible to see the tissue plans for the procedure.  The assistance was needed for blood control, tissue re-approximation and assisted with closure of the incision site.

## 2018-08-13 NOTE — Interval H&P Note (Signed)
History and Physical Interval Note:  08/13/2018 10:55 AM  Kaitlin Patterson  has presented today for surgery, with the diagnosis of Malignant Neoplasm Of Right Female Breast, Unspecified Estrogen Receptor Status, Unspecified Site Of Breast.  The various methods of treatment have been discussed with the patient and family. After consideration of risks, benefits and other options for treatment, the patient has consented to  Procedure(s) with comments: BREAST RECONSTRUCTION WITH PLACEMENT OF TISSUE EXPANDER AND FLEX HD (ACELLULAR HYDRATED DERMIS) (Bilateral) - Dr. Lilia Pro from [place] will be the first surgeon on the case. Please allow 2.5 hours for his portion of the surgery. BILATERAL SIMPLE MASTECTOMY (Bilateral) as a surgical intervention.  The patient's history has been reviewed, patient examined, no change in status, stable for surgery.  I have reviewed the patient's chart and labs.  Questions were answered to the patient's satisfaction.     [name]

## 2018-08-13 NOTE — Anesthesia Preprocedure Evaluation (Addendum)
Anesthesia Evaluation  Patient identified by MRN, date of birth, ID band Patient awake    Reviewed: Allergy & Precautions, NPO status , Patient's Chart, lab work & pertinent test results  History of Anesthesia Complications (+) PONV and history of anesthetic complications  Airway Mallampati: II  TM Distance: >3 FB Neck ROM: Full    Dental no notable dental hx. (+) Dental Advisory Given   Pulmonary neg pulmonary ROS,    Pulmonary exam normal        Cardiovascular negative cardio ROS Normal cardiovascular exam     Neuro/Psych negative neurological ROS     GI/Hepatic negative GI ROS, Neg liver ROS,   Endo/Other  Hypothyroidism   Renal/GU negative Renal ROS     Musculoskeletal negative musculoskeletal ROS (+)   Abdominal   Peds  Hematology negative hematology ROS (+)   Anesthesia Other Findings Day of surgery medications reviewed with the patient.  Reproductive/Obstetrics                            Anesthesia Physical Anesthesia Plan  ASA: II  Anesthesia Plan: General   Post-op Pain Management:  Regional for Post-op pain   Induction: Intravenous  PONV Risk Score and Plan: 4 or greater and Ondansetron, Dexamethasone, Scopolamine patch - Pre-op and Diphenhydramine  Airway Management Planned: LMA and Oral ETT  Additional Equipment:   Intra-op Plan:   Post-operative Plan: Extubation in OR  Informed Consent: I have reviewed the patients History and Physical, chart, labs and discussed the procedure including the risks, benefits and alternatives for the proposed anesthesia with the patient or authorized representative who has indicated his/her understanding and acceptance.     Dental advisory given  Plan Discussed with: CRNA and Anesthesiologist  Anesthesia Plan Comments:        Anesthesia Quick Evaluation

## 2018-08-14 ENCOUNTER — Other Ambulatory Visit: Payer: Self-pay

## 2018-08-14 ENCOUNTER — Encounter (HOSPITAL_COMMUNITY): Payer: Self-pay | Admitting: General Practice

## 2018-08-14 DIAGNOSIS — R222 Localized swelling, mass and lump, trunk: Secondary | ICD-10-CM | POA: Diagnosis not present

## 2018-08-14 DIAGNOSIS — C50911 Malignant neoplasm of unspecified site of right female breast: Secondary | ICD-10-CM | POA: Diagnosis not present

## 2018-08-14 DIAGNOSIS — E039 Hypothyroidism, unspecified: Secondary | ICD-10-CM | POA: Diagnosis not present

## 2018-08-14 DIAGNOSIS — D0511 Intraductal carcinoma in situ of right breast: Secondary | ICD-10-CM | POA: Diagnosis not present

## 2018-08-14 DIAGNOSIS — Z79890 Hormone replacement therapy (postmenopausal): Secondary | ICD-10-CM | POA: Diagnosis not present

## 2018-08-14 LAB — CBC
HCT: 31.4 % — ABNORMAL LOW (ref 36.0–46.0)
Hemoglobin: 10.7 g/dL — ABNORMAL LOW (ref 12.0–15.0)
MCH: 30.9 pg (ref 26.0–34.0)
MCHC: 34.1 g/dL (ref 30.0–36.0)
MCV: 90.8 fL (ref 80.0–100.0)
Platelets: 200 10*3/uL (ref 150–400)
RBC: 3.46 MIL/uL — ABNORMAL LOW (ref 3.87–5.11)
RDW: 13.4 % (ref 11.5–15.5)
WBC: 10.6 10*3/uL — ABNORMAL HIGH (ref 4.0–10.5)
nRBC: 0 % (ref 0.0–0.2)

## 2018-08-14 LAB — BASIC METABOLIC PANEL
Anion gap: 9 (ref 5–15)
BUN: 12 mg/dL (ref 6–20)
CO2: 23 mmol/L (ref 22–32)
Calcium: 8.5 mg/dL — ABNORMAL LOW (ref 8.9–10.3)
Chloride: 106 mmol/L (ref 98–111)
Creatinine, Ser: 0.82 mg/dL (ref 0.44–1.00)
GFR calc Af Amer: >60 mL/min (ref >60)
GFR calc non Af Amer: >60 mL/min (ref >60)
Glucose, Bld: 126 mg/dL — ABNORMAL HIGH (ref 70–99)
Potassium: 4.2 mmol/L (ref 3.5–5.1)
Sodium: 138 mmol/L (ref 135–145)

## 2018-08-14 LAB — HIV ANTIBODY (ROUTINE TESTING W REFLEX): HIV Screen 4th Generation wRfx: NONREACTIVE

## 2018-08-14 NOTE — Discharge Summary (Signed)
Physician Discharge Summary  Patient ID: Kaitlin Patterson MRN: [id number] DOB/AGE: 45-Oct-1977 28 y.o.  Admit date: 08/13/2018 Discharge date: 08/14/2018  Admission Diagnoses: Malignant Neoplasm Of Right Female Breast, Unspecified Estrogen Receptor Status, Unspecified Site Of Breast, Right axillary mass  Discharge Diagnoses:  Active Problems:   Breast cancer [facility])   Discharged Condition: good  Hospital Course: Kaitlin Patterson is a 43 year old female who presented to the [facility] main operating room on 08/13/2018 for a bilateral mastectomy and excision of right axillary mass with Dr. Orrin Brigham followed by immediate breast reconstruction with placement of expanders and Flex HD by Dr. Marla Roe.  Kaitlin Patterson reports that overnight she got very little sleep mostly due to discomfort in the bed.  She reports that her pain has been adequately controlled on oral analgesics.  This morning on evaluation she had no other complaints.  She has passed flatus, but has not had any bowel movements yet.  Denies any fevers, chills, vomiting.  Overall she is doing well.  She does have some small children at home but will be getting help from her mother and oldest child, who is 38.  Consults: None  Significant Diagnostic Studies: labs: CBC and BMP.  Treatments: IV hydration, antibiotics: Cipro and analgesia: Vicodin  Discharge Exam: Blood pressure (!) 140/50, pulse 74, temperature 98.5 F (36.9 C), temperature source Oral, resp. rate 17, height 5\' 9"  (1.753 m), weight 62.2 kg, last menstrual period 11/05/2016, SpO2 100 %. General appearance: alert, cooperative, no distress and resting in bed on evaluation Head: Normocephalic, without obvious abnormality, atraumatic Resp: Symmetric rise and fall.  Nonlabored breathing. Chest wall: Minimal tenderness. Breasts: Postmastectomy incisions healing nicely.  Dermabond in place.  Expanders filled with 300 cc of fluid bilaterally.  Right axillary incision healing.  All  incisions are C/D/I.  No drainage noted.  Bilateral JP drains in place with serosanguineous output. Extremities: extremities normal, atraumatic, no cyanosis or edema Pulses: 2+ and symmetric Incision/Wound: See above.  All incisions C/D/I.  JP drains in place bilaterally.  Disposition: Discharge disposition: 01-Home or Self Care       Discharge Instructions    Call MD for:  difficulty breathing, headache or visual disturbances   Complete by: As directed    Call MD for:  extreme fatigue   Complete by: As directed    Call MD for:  hives   Complete by: As directed    Call MD for:  persistant dizziness or light-headedness   Complete by: As directed    Call MD for:  persistant nausea and vomiting   Complete by: As directed    Call MD for:  redness, tenderness, or signs of infection (pain, swelling, redness, odor or green/yellow discharge around incision site)   Complete by: As directed    Call MD for:  severe uncontrolled pain   Complete by: As directed    Call MD for:  temperature >100.4   Complete by: As directed    Diet - low sodium heart healthy   Complete by: As directed    Increase activity slowly   Complete by: As directed      Allergies as of 08/14/2018      Reactions   Codeine Nausea Only   Adhesive [tape] Itching, Other (See Comments)   redness   Penicillins Rash   Did it involve swelling of the face/tongue/throat, SOB, or low BP? Unknown Did it involve sudden or severe rash/hives, skin peeling, or any reaction on the inside of your mouth or  nose? Unknown Did you need to seek medical attention at a hospital or doctor's office? Unknown When did it last happen? Childhood reaction If all above answers are "NO", may proceed with cephalosporin use.      Medication List    TAKE these medications   Biotin 10 MG Caps Take 10 mg by mouth at bedtime.   diphenhydrAMINE 25 MG tablet Commonly known as: BENADRYL Take 50 mg by mouth at bedtime.   HYDROQUINONE EX Apply  1 application topically at bedtime as needed (dark spots).   levothyroxine 75 MCG tablet Commonly known as: SYNTHROID TAKE 1 TABLET (75 MCG TOTAL) BY MOUTH DAILY BEFORE BREAKFAST.   Vitamin C 500 MG Chew Chew 500 mg by mouth daily.      Follow-up Information    Pollyann Samples, MD In 2 weeks.   Specialty: General Surgery Contact information: [address] [place] [address] [phone]        Wallace Going, DO In 1 week.   Specialty: Plastic Surgery Contact information: [place] [address] [phone]          Kaitlin Patterson is doing well overall.  She is status post postop day 1 from bilateral mastectomy with immediate reconstruction.  She is doing well in regards to pain control on oral medication.  She is comfortable going home and will be able to have assistance at home.  Follow-up in the office in 1 week.  Call with any questions sooner if needed.  Medications were called into the pharmacy prior to surgery and she has them at home.  She understands return precautions.  Signed: Carola Rhine Alisen Marsiglia 08/14/2018, 12:55 PM

## 2018-08-14 NOTE — Progress Notes (Signed)
Discharged home today,patient's husband driving im home.How to drain JP drain was sown to te patient.Personal belonings,discharged instructions given. Verbalized understanding of instructions

## 2018-08-16 ENCOUNTER — Telehealth: Payer: Self-pay | Admitting: *Deleted

## 2018-08-16 NOTE — Telephone Encounter (Signed)
Received a call on (08/15/18) from the patient regarding wearing the breast binder.  Patient stated that she had surgery on Monday (08/13/18).  She wanted to know if she's to wear the breast binder that was placed on her all the time.  Informed the patient that yes she is to wear the binder or a sports bra for the first 6 weeks around the clock, including while sleeping.  I informed her that it adds comfort and compression while she's moving around.   Informed her that she takes it off to take a shower or if she has drains can take off to do a bird bath.  Patient stated that she does have drains.  Informed her that because she has drains she's to do bird baths only. Asked the patient when is her follow-up appointment and she stated (08/21/18).  Patient verbalized understanding and agreed.//AB/CMA

## 2018-08-17 ENCOUNTER — Telehealth: Payer: Self-pay

## 2018-08-17 NOTE — Telephone Encounter (Signed)
Received call from patient with concerns regarding swelling. She had surgery on 08/13/2018. Since yesterday, she has noticed a feeling of increased tightness and  swelling around the binder. She reports minimal drainage and no fever or chills.

## 2018-08-17 NOTE — Telephone Encounter (Signed)
Called and spoke with the patient regarding the message below.  Informed the patient that I spoke with Dr. Marla Roe regarding her message, and she stated that it sounds normal.  She would like for her to massage her breast, and make sure the drain is compressed.  She can put ice under her arms 5 mins at a time, but do not put the ice on the breast.  This is for the swelling.  She can also come out of the binder and do a sports bra.  Patient verbalized understanding and agreed.  She asked if something happens over the weekend is there any way to reach Dr. Marla Roe and I informed her yes it is.  Again the patient verbalized understanding and agreed.//AB/CMA

## 2018-08-20 ENCOUNTER — Other Ambulatory Visit: Payer: Self-pay

## 2018-08-20 ENCOUNTER — Ambulatory Visit (INDEPENDENT_AMBULATORY_CARE_PROVIDER_SITE_OTHER): Payer: BC Managed Care – PPO | Admitting: Nurse Practitioner

## 2018-08-20 ENCOUNTER — Ambulatory Visit: Payer: BLUE CROSS/BLUE SHIELD | Admitting: Endocrinology

## 2018-08-20 ENCOUNTER — Encounter: Payer: Self-pay | Admitting: Surgical

## 2018-08-20 VITALS — BP 140/90 | HR 71 | Temp 98.2°F | Ht 69.0 in | Wt 136.0 lb

## 2018-08-20 DIAGNOSIS — C50911 Malignant neoplasm of unspecified site of right female breast: Secondary | ICD-10-CM

## 2018-08-20 DIAGNOSIS — Z9013 Acquired absence of bilateral breasts and nipples: Secondary | ICD-10-CM

## 2018-08-20 NOTE — Progress Notes (Signed)
   Subjective:     Patient ID: Kaitlin Patterson, female    DOB: 1975/06/11, 43 y.o.   MRN: [id number]  C.C.: redness on breasts  HPI: The patient is a 43 y.o. female here for follow-up after bilateral mastectomy with expander placement on 08/13/18 with Dr. Marla Roe and Dr. Lilia Pro. She had 300 cc saline placed in each expander during surgery. She has presents to the office today due to concern for redness and swelling of her bilateral breasts. She has JP drains in place x2. She has been milking the drains twice a day and getting between 3-7 ml output each time. She reports having some soreness, but "not bad." Her skin is itching in multiple areas. She has been taking benadryl to sleep at night. Her mother has been helping with her care.    Review of Systems  Constitutional: Negative for chills, fever and malaise/fatigue.  HENT: Negative.   Respiratory: Negative.   Cardiovascular: Negative.   Gastrointestinal: Negative.   Genitourinary: Negative.   Musculoskeletal:       Muscle soreness around breasts and back  Skin: Positive for itching and rash.  Neurological: Negative.      Objective:   Vital Signs BP 140/90 (BP Location: Left Leg, Patient Position: Sitting, Cuff Size: Normal)   Pulse 71   Temp 98.2 F (36.8 C) (Temporal)   Ht 5\' 9"  (1.753 m)   Wt 136 lb (61.7 kg)   LMP 11/05/2016 (Approximate)   SpO2 100%   BMI 20.08 kg/m  Vital Signs and Nursing Note Reviewed  Physical Examination: Gen: alert, calm, no acute distress HEENT: normocephalic, atraumatic Lungs: unlabored breathing pattern Breast: bilateral mastectomy incisions clean, dry, intact; JP drain x1 sutured inferior to bilateral axilla, drain suction activated with small amount serosanguinous output in bilateral drain bulbs; multiple areas of pathy erythema on bilateral breast area and bilateral lateral chest, 2 small vesicles within erythematous patch on left breast immediately superior to incision, linear erythema along  path of drain tubing bilaterally; mild breast swelling bilaterally, no fluid wave appreciated    Assessment/Plan:     ICD-10-CM   1. Malignant neoplasm of right female breast, unspecified estrogen receptor status, unspecified site of breast ([place])  C50.911   2. Acquired absence of breast and absent nipple, bilateral  Z90.13     Kaitlin Patterson is a 43 yo female POD #7 s/p bilateral mastectomy with immediate reconstruction with tissue expander placement. Patient has contact dermatitis on both breasts and lateral chest, which appear to be secondary to the JP drain tubing. She has some swelling of both breasts, which is expected. Her pain is well controlled. The drains were milked with minimal output, but left in place. ABD pads were placed under the drain tubing to prevent contact with her skin. She was advised to continue doing this at home. She is taking benadryl for itching. Return on 08/24/18.    Right: 0 cc for a total of 300 / 535 cc Left: 0 cc for a total of 300 / 535 cc   Mayah Dozier-Lineberger, NP 08/20/2018, 10:54 AM

## 2018-08-21 ENCOUNTER — Encounter: Payer: BC Managed Care – PPO | Admitting: Plastic Surgery

## 2018-08-22 DIAGNOSIS — D0511 Intraductal carcinoma in situ of right breast: Secondary | ICD-10-CM | POA: Diagnosis not present

## 2018-08-23 ENCOUNTER — Telehealth: Payer: Self-pay

## 2018-08-23 NOTE — Telephone Encounter (Signed)

## 2018-08-24 ENCOUNTER — Other Ambulatory Visit: Payer: Self-pay

## 2018-08-24 ENCOUNTER — Encounter: Payer: Self-pay | Admitting: Plastic Surgery

## 2018-08-24 ENCOUNTER — Ambulatory Visit (INDEPENDENT_AMBULATORY_CARE_PROVIDER_SITE_OTHER): Payer: BC Managed Care – PPO | Admitting: Plastic Surgery

## 2018-08-24 VITALS — HR 83 | Temp 98.3°F | Ht 69.0 in | Wt 136.0 lb

## 2018-08-24 DIAGNOSIS — Z9013 Acquired absence of bilateral breasts and nipples: Secondary | ICD-10-CM

## 2018-08-24 DIAGNOSIS — Z9010 Acquired absence of unspecified breast and nipple: Secondary | ICD-10-CM | POA: Insufficient documentation

## 2018-08-24 DIAGNOSIS — C50911 Malignant neoplasm of unspecified site of right female breast: Secondary | ICD-10-CM

## 2018-08-24 NOTE — Progress Notes (Signed)
   Subjective:    Patient ID: Kaitlin Patterson, female    DOB: 1975-06-27, 43 y.o.   MRN: [id number]  The patient is a 43 year old female here for postop follow-up after undergoing bilateral mastectomies with expander and Flex HD placement.  She has 300 cc in each expander.  Her drains are very minimal.  Her incisions are healing nicely.  She is pleased with the results.  We offered to remove some of the fluid to alleviate some of the pressure but she is okay with keeping it.   Review of Systems  Constitutional: Positive for activity change.  HENT: Negative.   Eyes: Negative.   Respiratory: Negative.   Cardiovascular: Negative.   Gastrointestinal: Negative.   Endocrine: Negative.   Genitourinary: Negative.   Musculoskeletal: Negative.        Objective:   Physical Exam Vitals signs and nursing note reviewed.  Constitutional:      Appearance: Normal appearance.  HENT:     Head: Normocephalic and atraumatic.  Cardiovascular:     Rate and Rhythm: Normal rate.  Pulmonary:     Effort: Pulmonary effort is normal.  Neurological:     General: No focal deficit present.     Mental Status: She is alert and oriented to person, place, and time.  Psychiatric:        Mood and Affect: Mood normal.        Behavior: Behavior normal.         Assessment & Plan:     ICD-10-CM   1. Malignant neoplasm of right female breast, unspecified estrogen receptor status, unspecified site of breast ([place])  C50.911   2. Status post bilateral mastectomy  Z90.13      We will plan to remove the drains next week.  Continue with sports bra.

## 2018-08-27 ENCOUNTER — Telehealth: Payer: Self-pay

## 2018-08-27 NOTE — Telephone Encounter (Signed)

## 2018-08-28 ENCOUNTER — Encounter: Payer: Self-pay | Admitting: Nurse Practitioner

## 2018-08-28 ENCOUNTER — Ambulatory Visit (INDEPENDENT_AMBULATORY_CARE_PROVIDER_SITE_OTHER): Payer: BC Managed Care – PPO | Admitting: Nurse Practitioner

## 2018-08-28 ENCOUNTER — Other Ambulatory Visit: Payer: Self-pay

## 2018-08-28 VITALS — BP 128/80 | HR 89 | Temp 98.1°F | Ht 69.0 in | Wt 138.0 lb

## 2018-08-28 DIAGNOSIS — C50911 Malignant neoplasm of unspecified site of right female breast: Secondary | ICD-10-CM

## 2018-08-28 DIAGNOSIS — Z9013 Acquired absence of bilateral breasts and nipples: Secondary | ICD-10-CM

## 2018-08-28 NOTE — Progress Notes (Signed)
Patient ID: Kaitlin Patterson, female    DOB: October 27, 1975, 43 y.o.   MRN: [id number]   C.C.: breast expander follow up  Kaitlin Patterson is a 43 yo female who presents for post-op follow-up after undergoing bilateral mastectomies with expander and Flex HD placement. She had 300 cc in each expander during surgery. She is feeling well today. The previous rash from her drain tubing has resolved. She has had between 1-3 ml output from each JP drain over the last week. She is "pretty happy" with her current breast size, but is considering a fill today.   Review of Systems  Constitutional: Positive for activity change.  HENT: Negative.   Respiratory: Negative.   Cardiovascular: Negative.   Gastrointestinal: Negative.   Genitourinary: Negative.   Musculoskeletal: Negative.   Skin:       Breast incisions  Neurological: Negative.     Past Medical History:  Diagnosis Date  . Allergic rhinitis   . Cancer ([place])    stage "0" breast cancer  . Hypothyroidism   . PONV (postoperative nausea and vomiting)     Past Surgical History:  Procedure Laterality Date  . ABDOMINAL HYSTERECTOMY  2018  . BREAST BIOPSY     x4  . BREAST RECONSTRUCTION WITH PLACEMENT OF TISSUE EXPANDER AND FLEX HD (ACELLULAR HYDRATED DERMIS) Bilateral 08/13/2018   Procedure: BREAST RECONSTRUCTION WITH PLACEMENT OF TISSUE EXPANDER AND FLEX HD (ACELLULAR HYDRATED DERMIS);  Surgeon: Wallace Going, DO;  Location: [facility];  Service: Plastics;  Laterality: Bilateral;  Dr. Lilia Pro from [place] will be the first surgeon on the case. Please allow 2.5 hours for his portion of the surgery.  . CESAREAN SECTION N/A 2016   cesarean done when patient had twins  . HYSTEROTOMY Bilateral 11/29/2016   Partial all taken but overies.   Marland Kitchen SIMPLE MASTECTOMY Bilateral 08/13/2018   WITH TISSUE EXPANDERS & RECONSTUCTION  . SIMPLE MASTECTOMY WITH AXILLARY SENTINEL NODE BIOPSY Bilateral 08/13/2018   Procedure: BILATERAL SIMPLE MASTECTOMY AND EXCISION OF  RIGHT AXILLARY MASS.;  Surgeon: Pollyann Samples, MD;  Location: [facility];  Service: General;  Laterality: Bilateral;  . TONSILLECTOMY  1996  . TUBAL LIGATION  2016      Current Outpatient Medications:  .  Ascorbic Acid (VITAMIN C) 500 MG CHEW, Chew 500 mg by mouth daily., Disp: , Rfl:  .  Biotin 10 MG CAPS, Take 10 mg by mouth at bedtime. , Disp: , Rfl:  .  diphenhydrAMINE (BENADRYL) 25 MG tablet, Take 50 mg by mouth at bedtime. , Disp: , Rfl:  .  HYDROQUINONE EX, Apply 1 application topically at bedtime as needed (dark spots)., Disp: , Rfl:  .  levothyroxine (SYNTHROID) 75 MCG tablet, TAKE 1 TABLET (75 MCG TOTAL) BY MOUTH DAILY BEFORE BREAKFAST., Disp: 90 tablet, Rfl: 3   Objective:   Vitals:   08/28/18 1400  BP: 128/80  Pulse: 89  Temp: 98.1 F (36.7 C)  SpO2: 97%    Physical Exam  General: alert, calm, no acute distress HEENT: normal Neck: supple, full ROM Chest: symmetrical rise and fall Lungs: unlabored breathing Breast: bilateral mastectomy incisions clean, dry, intact; JP drain x1 sutured inferior to bilateral axilla; no erythema or ecchymosis Cardiac: +2 bilateral radial pulse, cap refill <3 sec Musculoskeletal: MAEx4 Neuro: A&O x3, calm, cooperative, steady gait   Assessment & Plan:  Malignant neoplasm of right female breast, unspecified estrogen receptor status, unspecified site of breast ([facility])  Status post bilateral mastectomy  Acquired absence  of breast and absent nipple, bilateral  Kaitlin Patterson is a 43 yo female s/p bilateral mastectomies with expander and Flex HD placement on 08/13/18. Her incisions are healing nicely. No signs of infection or seroma. There has been very little output from the JP drains over the past week. Both JP drains were removed today without difficulty. She is happy with her current breast size. She will need to wait at least 2 months before undergoing implant exchange. Return in 1 month.   We placed injectable saline in the Expander using a  sterile technique: Right: 50 cc for a total of 350 / 535 cc Left: 50 cc for a total of 350 / 535 cc     Mayah Dozier-Lineberger, NP

## 2018-09-03 ENCOUNTER — Telehealth: Payer: Self-pay

## 2018-09-03 NOTE — Telephone Encounter (Signed)
I returned Kaitlin Patterson's phone call. She states she has terrible itching on her breasts, axilla, stomach, and back. She denies having any sort of rash or skin changes. The itching is worse at night and she has difficulty sleeping. She has tried hydrocortisone cream, without any improvement. I informed Kaitlin Patterson I would notify Dr. Marla Roe for advice.

## 2018-09-03 NOTE — Telephone Encounter (Signed)
Patient states that she is having intense itching in back and breast. Could anything be prescribed for itching? Patient has used creams with no relief. No rash, no redness, no irritation. Patient requesting call back at earliest convenience.

## 2018-09-03 NOTE — Telephone Encounter (Signed)
I notified Kaitlin Patterson that I spoke with Dr. Marla Roe and Verdie Shire, PA about the itching. Recommended trying Allegra. Kaitlin Patterson agreed to try the Allegra. She also plans to discuss the itching with her general surgeon, Dr. Lissa Merlin. I agreed with this plan. I requested she notify this office with any updates.

## 2018-09-17 ENCOUNTER — Other Ambulatory Visit: Payer: Self-pay

## 2018-09-17 ENCOUNTER — Ambulatory Visit (INDEPENDENT_AMBULATORY_CARE_PROVIDER_SITE_OTHER): Payer: BC Managed Care – PPO | Admitting: Nurse Practitioner

## 2018-09-17 ENCOUNTER — Encounter: Payer: Self-pay | Admitting: Nurse Practitioner

## 2018-09-17 VITALS — BP 112/70 | HR 71 | Temp 98.0°F | Ht 69.0 in | Wt 136.2 lb

## 2018-09-17 DIAGNOSIS — C50911 Malignant neoplasm of unspecified site of right female breast: Secondary | ICD-10-CM

## 2018-09-17 DIAGNOSIS — Z9013 Acquired absence of bilateral breasts and nipples: Secondary | ICD-10-CM

## 2018-09-17 NOTE — Progress Notes (Signed)
Patient ID: Kaitlin Patterson, female    DOB: 1975/05/04, 43 y.o.   MRN: [id number]   Chief Complaint  Patient presents with  . Follow-up    3 weeks    Kaitlin Patterson is a 43 yo female who presents for follow up after undergoing bilateral mastectomies with expander and Flex HD placement. Patient's post-op course has been complicated by extreme itching on her entire upper body, without any signs of rash. Patient states the itching has greatly improved since taking Zyrtec. Patient states she is very happy with the current size and does not want to go any bigger. Patient is hopeful the breasts and skin around the breasts can be "rounded out" during surgery. Patient is also interest in nipple tattoos.   Review of Systems  Constitutional: Positive for activity change.  HENT: Negative.   Respiratory: Negative.   Cardiovascular: Negative.   Gastrointestinal: Negative.   Genitourinary: Negative.   Skin:       itching  Neurological: Negative.     Past Medical History:  Diagnosis Date  . Allergic rhinitis   . Cancer ([place])    stage "0" breast cancer  . Hypothyroidism   . PONV (postoperative nausea and vomiting)     Past Surgical History:  Procedure Laterality Date  . ABDOMINAL HYSTERECTOMY  2018  . BREAST BIOPSY     x4  . BREAST RECONSTRUCTION WITH PLACEMENT OF TISSUE EXPANDER AND FLEX HD (ACELLULAR HYDRATED DERMIS) Bilateral 08/13/2018   Procedure: BREAST RECONSTRUCTION WITH PLACEMENT OF TISSUE EXPANDER AND FLEX HD (ACELLULAR HYDRATED DERMIS);  Surgeon: Wallace Going, DO;  Location: [facility];  Service: Plastics;  Laterality: Bilateral;  Dr. Lilia Pro from [place] will be the first surgeon on the case. Please allow 2.5 hours for his portion of the surgery.  . CESAREAN SECTION N/A 2016   cesarean done when patient had twins  . HYSTEROTOMY Bilateral 11/29/2016   Partial all taken but overies.   Marland Kitchen SIMPLE MASTECTOMY Bilateral 08/13/2018   WITH TISSUE EXPANDERS & RECONSTUCTION  . SIMPLE  MASTECTOMY WITH AXILLARY SENTINEL NODE BIOPSY Bilateral 08/13/2018   Procedure: BILATERAL SIMPLE MASTECTOMY AND EXCISION OF RIGHT AXILLARY MASS.;  Surgeon: Pollyann Samples, MD;  Location: [facility];  Service: General;  Laterality: Bilateral;  . TONSILLECTOMY  1996  . TUBAL LIGATION  2016      Current Outpatient Medications:  .  Ascorbic Acid (VITAMIN C) 500 MG CHEW, Chew 500 mg by mouth daily., Disp: , Rfl:  .  Biotin 10 MG CAPS, Take 10 mg by mouth at bedtime. , Disp: , Rfl:  .  Cetirizine HCl (ZYRTEC ALLERGY) 10 MG CAPS, Take 1 capsule by mouth at bedtime., Disp: , Rfl:  .  HYDROQUINONE EX, Apply 1 application topically at bedtime as needed (dark spots)., Disp: , Rfl:  .  levothyroxine (SYNTHROID) 75 MCG tablet, TAKE 1 TABLET (75 MCG TOTAL) BY MOUTH DAILY BEFORE BREAKFAST., Disp: 90 tablet, Rfl: 3   Objective:   Vitals:   09/17/18 1120  BP: 112/70  Pulse: 71  Temp: 98 F (36.7 C)  SpO2: 100%    Physical Exam  General: alert, calm, no acute distress HEENT:normocephalic Neck: supple, full ROM Chest: symmetrical rise and fall Lungs: unlabored breathing Breast: bilateral breast mastectomy transverse incisions clean, dry, intact, flaking skin glue, no erythema or drainage  Cardiac: +2 bilateral radial pulse, cap refill <3 sec Abdomen: soft, non-distended, non-tender Musculoskeletal: MAEx4 Neuro: A&O x3, calm, cooperative, steady gait Skin: warm, dry, no  rash or discoloration   Assessment & Plan:  Malignant neoplasm of right female breast, unspecified estrogen receptor status, unspecified site of breast ([place])  Status post bilateral mastectomy  Acquired absence of breast and absent nipple, bilateral  Kaitlin Patterson is a 43 yo female s/p bilateral mastectomies with expander and Flex HD placement on 08/13/18. Her incisions are healing nicely. No signs of infection or seroma. Patient is very happy with her current breast size. Patient has very large breasts prior to surgery. Patient would  like to move forward with scheduling the implant exchange. Patient aware she must wait a minimum of 2 months after initial reconstructive surgery to undergo the implant exchange. Will schedule pre-op appointment once surgery date is confirmed.   We did NOT place injectable saline in the expanders during this visit (per patient request). Right: total of 350 / 535 cc Left: total of 350 / 535 cc   Picture placed in chart with patient's consent.  Alfredo Batty, NP

## 2018-09-19 DIAGNOSIS — D0511 Intraductal carcinoma in situ of right breast: Secondary | ICD-10-CM | POA: Diagnosis not present

## 2018-10-11 ENCOUNTER — Telehealth: Payer: Self-pay

## 2018-10-11 NOTE — Telephone Encounter (Signed)

## 2018-10-12 ENCOUNTER — Ambulatory Visit (INDEPENDENT_AMBULATORY_CARE_PROVIDER_SITE_OTHER): Payer: BC Managed Care – PPO | Admitting: Surgical

## 2018-10-12 ENCOUNTER — Encounter: Payer: BC Managed Care – PPO | Admitting: Surgical

## 2018-10-12 ENCOUNTER — Other Ambulatory Visit: Payer: Self-pay

## 2018-10-12 ENCOUNTER — Encounter: Payer: Self-pay | Admitting: Surgical

## 2018-10-12 VITALS — BP 112/76 | HR 75 | Temp 97.8°F | Ht 69.0 in | Wt 142.0 lb

## 2018-10-12 DIAGNOSIS — Z9013 Acquired absence of bilateral breasts and nipples: Secondary | ICD-10-CM

## 2018-10-12 DIAGNOSIS — C50911 Malignant neoplasm of unspecified site of right female breast: Secondary | ICD-10-CM

## 2018-10-12 MED ORDER — CIPROFLOXACIN HCL 500 MG PO TABS: 500.0000 mg | ORAL_TABLET | Freq: Two times a day (BID) | ORAL | 0 refills | Status: AC

## 2018-10-12 MED ORDER — ONDANSETRON HCL 4 MG PO TABS: 4.0000 mg | ORAL_TABLET | Freq: Three times a day (TID) | ORAL | 0 refills | Status: DC | PRN

## 2018-10-12 MED ORDER — HYDROCODONE-ACETAMINOPHEN 5-325 MG PO TABS: 1.0000 | ORAL_TABLET | Freq: Four times a day (QID) | ORAL | 0 refills | Status: AC | PRN

## 2018-10-12 NOTE — Progress Notes (Signed)
Patient ID: Kaitlin Patterson, female    DOB: 1975-06-29, 43 y.o.   MRN: [id number]  Chief Complaint  Patient presents with  . Pre-op Exam    Removal of (B) breast expanders and placement of silicone implants      ICD-10-CM   1. Status post bilateral mastectomy  Z90.13   2. Malignant neoplasm of right female breast, unspecified estrogen receptor status, unspecified site of breast ([place])  C50.911      History of Present Illness: Kaitlin Patterson is a 43 y.o.  female  with a history of right breast cancer and underwent bilateral breast reconstruction post-mastectomy on 08/13/18. She presents for preoperative evaluation for upcoming procedure, removal of bilateral breast tissue expanders with placement of silicone implants., scheduled for 10/29/18 with Dr. Marla Roe.  The patient has not had problems with anesthesia. She does not smoke. No recent colds or illnesses. No hx or fmhx of dvt/pe. No hx of TIA, MI, CVA.   She takes synthroid for hypothyroidism.  She currently has 350/535 cc in both her right and left expanders. She is happy with this size.   Past Medical History: Allergies: Allergies  Allergen Reactions  . Codeine Nausea Only  . Adhesive [Tape] Itching and Other (See Comments)    redness  . Penicillins Rash    Did it involve swelling of the face/tongue/throat, SOB, or low BP? Unknown Did it involve sudden or severe rash/hives, skin peeling, or any reaction on the inside of your mouth or nose? Unknown Did you need to seek medical attention at a hospital or doctor's office? Unknown When did it last happen? Childhood reaction If all above answers are "NO", may proceed with cephalosporin use.     Current Medications:  Current Outpatient Medications:  .  Cetirizine HCl (ZYRTEC ALLERGY) 10 MG CAPS, Take 1 capsule by mouth at bedtime., Disp: , Rfl:  .  HYDROQUINONE EX, Apply 1 application topically at bedtime as needed (dark spots)., Disp: , Rfl:  .  levothyroxine (SYNTHROID) 75 MCG  tablet, TAKE 1 TABLET (75 MCG TOTAL) BY MOUTH DAILY BEFORE BREAKFAST., Disp: 90 tablet, Rfl: 3 .  Ascorbic Acid (VITAMIN C) 500 MG CHEW, Chew 500 mg by mouth daily., Disp: , Rfl:   Past Medical Problems: Past Medical History:  Diagnosis Date  . Allergic rhinitis   . Cancer ([place])    stage "0" breast cancer  . Hypothyroidism   . PONV (postoperative nausea and vomiting)     Past Surgical History: Past Surgical History:  Procedure Laterality Date  . ABDOMINAL HYSTERECTOMY  2018  . BREAST BIOPSY     x4  . BREAST RECONSTRUCTION WITH PLACEMENT OF TISSUE EXPANDER AND FLEX HD (ACELLULAR HYDRATED DERMIS) Bilateral 08/13/2018   Procedure: BREAST RECONSTRUCTION WITH PLACEMENT OF TISSUE EXPANDER AND FLEX HD (ACELLULAR HYDRATED DERMIS);  Surgeon: Wallace Going, DO;  Location: [facility];  Service: Plastics;  Laterality: Bilateral;  Dr. Lilia Pro from [place] will be the first surgeon on the case. Please allow 2.5 hours for his portion of the surgery.  . CESAREAN SECTION N/A 2016   cesarean done when patient had twins  . HYSTEROTOMY Bilateral 11/29/2016   Partial all taken but overies.   Marland Kitchen SIMPLE MASTECTOMY Bilateral 08/13/2018   WITH TISSUE EXPANDERS & RECONSTUCTION  . SIMPLE MASTECTOMY WITH AXILLARY SENTINEL NODE BIOPSY Bilateral 08/13/2018   Procedure: BILATERAL SIMPLE MASTECTOMY AND EXCISION OF RIGHT AXILLARY MASS.;  Surgeon: Pollyann Samples, MD;  Location: [facility];  Service: General;  Laterality: Bilateral;  . TONSILLECTOMY  1996  . TUBAL LIGATION  2016    Social History: Social History   Socioeconomic History  . Marital status: Married    Spouse name: Not on file  . Number of children: Not on file  . Years of education: Not on file  . Highest education level: Not on file  Occupational History  . Not on file  Social Needs  . Financial resource strain: Not on file  . Food insecurity    Worry: Not on file    Inability: Not on file  . Transportation needs    Medical: Not on file     Non-medical: Not on file  Tobacco Use  . Smoking status: Never Smoker  . Smokeless tobacco: Never Used  Substance and Sexual Activity  . Alcohol use: No    Frequency: Never  . Drug use: No  . Sexual activity: Not Currently    Partners: Male  Lifestyle  . Physical activity    Days per week: Not on file    Minutes per session: Not on file  . Stress: Not on file  Relationships  . Social Herbalist on phone: Not on file    Gets together: Not on file    Attends religious service: Not on file    Active member of club or organization: Not on file    Attends meetings of clubs or organizations: Not on file    Relationship status: Not on file  . Intimate partner violence    Fear of current or ex partner: Not on file    Emotionally abused: Not on file    Physically abused: Not on file    Forced sexual activity: Not on file  Other Topics Concern  . Not on file  Social History Narrative  . Not on file    Family History: Family History  Problem Relation Age of Onset  . Hypothyroidism Mother     Review of Systems: Review of Systems  Constitutional: Negative for chills, diaphoresis, fever, malaise/fatigue and weight loss.  Respiratory: Negative for cough, hemoptysis, sputum production, shortness of breath and wheezing.   Cardiovascular: Negative for chest pain, palpitations, orthopnea, claudication and leg swelling.  Gastrointestinal: Negative for abdominal pain, diarrhea, nausea and vomiting.  Genitourinary: Negative.   Musculoskeletal: Negative for back pain, myalgias and neck pain.  Skin: Negative for itching and rash.  Neurological: Negative for dizziness, focal weakness, weakness and headaches.    Physical Exam: Vital Signs BP 112/76 (BP Location: Left Arm, Patient Position: Sitting, Cuff Size: Normal)   Pulse 75   Temp 97.8 F (36.6 C) (Temporal)   Ht 5\' 9"  (1.753 m)   Wt 142 lb (64.4 kg)   LMP 11/05/2016 (Approximate)   SpO2 100%   BMI 20.97 kg/m   Physical Exam Exam conducted with a chaperone present.  Constitutional:      General: She is not in acute distress.    Appearance: Normal appearance. She is not ill-appearing.  HENT:     Head: Normocephalic and atraumatic.  Eyes:     Pupils: Pupils are equal, round Neck:     Musculoskeletal: Normal range of motion.  Cardiovascular:     Rate and Rhythm: Normal rate and regular rhythm.     Pulses: Normal pulses.     Heart sounds: Normal heart sounds. No murmur.  Pulmonary:     Effort: Pulmonary effort is normal. No respiratory distress.     Breath sounds: Normal breath  sounds. No wheezing.  Abdominal:     General: Abdomen is flat. There is no distension.     Palpations: Abdomen is soft.     Tenderness: There is no abdominal tenderness.  Breast: Bilateral horizontal mastectomy incisions with flaking dermabond. Incision c/d/i. No erythema. Expanders with 350 cc fluid. Musculoskeletal: Normal range of motion.  Skin:    General: Skin is warm and dry.     Findings: No erythema or rash.  Neurological:     General: No focal deficit present.     Mental Status: She is alert and oriented to person, place, and time. Mental status is at baseline.     Motor: No weakness.  Psychiatric:        Mood and Affect: Mood normal.        Behavior: Behavior normal.    Assessment/Plan: Mrs. Mccalpin presents for preoperative evaluation for upcoming procedure, removal of bilateral breast tissue expanders with placement of silicone implants., scheduled for 10/29/18 with Dr. Marla Roe.  She also asked about removal of right axillary/breast excess tissue after removal of axillary mass during prior operation.   Prescription sent to pharmacy. Covid test scheduled.  The risks that can be encountered with and after placement of a breast implant placement were discussed and include the following but not limited to these: bleeding, infection, delayed healing, anesthesia risks, skin sensation changes, injury to  structures including nerves, blood vessels, and muscles which may be temporary or permanent, allergies to tape, suture materials and glues, blood products, topical preparations or injected agents, skin contour irregularities, skin discoloration and swelling, deep vein thrombosis, cardiac and pulmonary complications, pain, which may persist, fluid accumulation, wrinkling of the skin over the implant, changes in nipple or breast sensation, implant leakage or rupture, faulty position of the implant, persistent pain, formation of tight scar tissue around the implant (capsular contracture), possible need for revisional surgery or staged procedures.    Risks, benefits, and alternatives of procedure discussed, questions answered and consent obtained.     Electronically signed by: Carola Rhine Loyalty Brashier, PA-C 10/12/2018 12:07 PM

## 2018-10-12 NOTE — H&P (View-Only) (Signed)
Patient ID: Kaitlin Patterson, female    DOB: Oct 25, 1975, 43 y.o.   MRN: [id number]  Chief Complaint  Patient presents with  . Pre-op Exam    Removal of (B) breast expanders and placement of silicone implants      ICD-10-CM   1. Status post bilateral mastectomy  Z90.13   2. Malignant neoplasm of right female breast, unspecified estrogen receptor status, unspecified site of breast ([place])  C50.911      History of Present Illness: Kaitlin Patterson is a 43 y.o.  female  with a history of right breast cancer and underwent bilateral breast reconstruction post-mastectomy on 08/13/18. She presents for preoperative evaluation for upcoming procedure, removal of bilateral breast tissue expanders with placement of silicone implants., scheduled for 10/29/18 with Dr. Marla Roe.  The patient has not had problems with anesthesia. She does not smoke. No recent colds or illnesses. No hx or fmhx of dvt/pe. No hx of TIA, MI, CVA.   She takes synthroid for hypothyroidism.  She currently has 350/535 cc in both her right and left expanders. She is happy with this size.   Past Medical History: Allergies: Allergies  Allergen Reactions  . Codeine Nausea Only  . Adhesive [Tape] Itching and Other (See Comments)    redness  . Penicillins Rash    Did it involve swelling of the face/tongue/throat, SOB, or low BP? Unknown Did it involve sudden or severe rash/hives, skin peeling, or any reaction on the inside of your mouth or nose? Unknown Did you need to seek medical attention at a hospital or doctor's office? Unknown When did it last happen? Childhood reaction If all above answers are "NO", may proceed with cephalosporin use.     Current Medications:  Current Outpatient Medications:  .  Cetirizine HCl (ZYRTEC ALLERGY) 10 MG CAPS, Take 1 capsule by mouth at bedtime., Disp: , Rfl:  .  HYDROQUINONE EX, Apply 1 application topically at bedtime as needed (dark spots)., Disp: , Rfl:  .  levothyroxine (SYNTHROID) 75 MCG  tablet, TAKE 1 TABLET (75 MCG TOTAL) BY MOUTH DAILY BEFORE BREAKFAST., Disp: 90 tablet, Rfl: 3 .  Ascorbic Acid (VITAMIN C) 500 MG CHEW, Chew 500 mg by mouth daily., Disp: , Rfl:   Past Medical Problems: Past Medical History:  Diagnosis Date  . Allergic rhinitis   . Cancer ([place])    stage "0" breast cancer  . Hypothyroidism   . PONV (postoperative nausea and vomiting)     Past Surgical History: Past Surgical History:  Procedure Laterality Date  . ABDOMINAL HYSTERECTOMY  2018  . BREAST BIOPSY     x4  . BREAST RECONSTRUCTION WITH PLACEMENT OF TISSUE EXPANDER AND FLEX HD (ACELLULAR HYDRATED DERMIS) Bilateral 08/13/2018   Procedure: BREAST RECONSTRUCTION WITH PLACEMENT OF TISSUE EXPANDER AND FLEX HD (ACELLULAR HYDRATED DERMIS);  Surgeon: Wallace Going, DO;  Location: [facility];  Service: Plastics;  Laterality: Bilateral;  Dr. Lilia Pro from [facility] will be the first surgeon on the case. Please allow 2.5 hours for his portion of the surgery.  . CESAREAN SECTION N/A 2016   cesarean done when patient had twins  . HYSTEROTOMY Bilateral 11/29/2016   Partial all taken but overies.   Marland Kitchen SIMPLE MASTECTOMY Bilateral 08/13/2018   WITH TISSUE EXPANDERS & RECONSTUCTION  . SIMPLE MASTECTOMY WITH AXILLARY SENTINEL NODE BIOPSY Bilateral 08/13/2018   Procedure: BILATERAL SIMPLE MASTECTOMY AND EXCISION OF RIGHT AXILLARY MASS.;  Surgeon: Pollyann Samples, MD;  Location: [facility];  Service: General;  Laterality: Bilateral;  . TONSILLECTOMY  1996  . TUBAL LIGATION  2016    Social History: Social History   Socioeconomic History  . Marital status: Married    Spouse name: Not on file  . Number of children: Not on file  . Years of education: Not on file  . Highest education level: Not on file  Occupational History  . Not on file  Social Needs  . Financial resource strain: Not on file  . Food insecurity    Worry: Not on file    Inability: Not on file  . Transportation needs    Medical: Not on file     Non-medical: Not on file  Tobacco Use  . Smoking status: Never Smoker  . Smokeless tobacco: Never Used  Substance and Sexual Activity  . Alcohol use: No    Frequency: Never  . Drug use: No  . Sexual activity: Not Currently    Partners: Male  Lifestyle  . Physical activity    Days per week: Not on file    Minutes per session: Not on file  . Stress: Not on file  Relationships  . Social Herbalist on phone: Not on file    Gets together: Not on file    Attends religious service: Not on file    Active member of club or organization: Not on file    Attends meetings of clubs or organizations: Not on file    Relationship status: Not on file  . Intimate partner violence    Fear of current or ex partner: Not on file    Emotionally abused: Not on file    Physically abused: Not on file    Forced sexual activity: Not on file  Other Topics Concern  . Not on file  Social History Narrative  . Not on file    Family History: Family History  Problem Relation Age of Onset  . Hypothyroidism Mother     Review of Systems: Review of Systems  Constitutional: Negative for chills, diaphoresis, fever, malaise/fatigue and weight loss.  Respiratory: Negative for cough, hemoptysis, sputum production, shortness of breath and wheezing.   Cardiovascular: Negative for chest pain, palpitations, orthopnea, claudication and leg swelling.  Gastrointestinal: Negative for abdominal pain, diarrhea, nausea and vomiting.  Genitourinary: Negative.   Musculoskeletal: Negative for back pain, myalgias and neck pain.  Skin: Negative for itching and rash.  Neurological: Negative for dizziness, focal weakness, weakness and headaches.    Physical Exam: Vital Signs BP 112/76 (BP Location: Left Arm, Patient Position: Sitting, Cuff Size: Normal)   Pulse 75   Temp 97.8 F (36.6 C) (Temporal)   Ht 5\' 9"  (1.753 m)   Wt 142 lb (64.4 kg)   LMP 11/05/2016 (Approximate)   SpO2 100%   BMI 20.97 kg/m   Physical Exam Exam conducted with a chaperone present.  Constitutional:      General: She is not in acute distress.    Appearance: Normal appearance. She is not ill-appearing.  HENT:     Head: Normocephalic and atraumatic.  Eyes:     Pupils: Pupils are equal, round Neck:     Musculoskeletal: Normal range of motion.  Cardiovascular:     Rate and Rhythm: Normal rate and regular rhythm.     Pulses: Normal pulses.     Heart sounds: Normal heart sounds. No murmur.  Pulmonary:     Effort: Pulmonary effort is normal. No respiratory distress.     Breath sounds: Normal breath  sounds. No wheezing.  Abdominal:     General: Abdomen is flat. There is no distension.     Palpations: Abdomen is soft.     Tenderness: There is no abdominal tenderness.  Breast: Bilateral horizontal mastectomy incisions with flaking dermabond. Incision c/d/i. No erythema. Expanders with 350 cc fluid. Musculoskeletal: Normal range of motion.  Skin:    General: Skin is warm and dry.     Findings: No erythema or rash.  Neurological:     General: No focal deficit present.     Mental Status: She is alert and oriented to person, place, and time. Mental status is at baseline.     Motor: No weakness.  Psychiatric:        Mood and Affect: Mood normal.        Behavior: Behavior normal.    Assessment/Plan: Mrs. Magos presents for preoperative evaluation for upcoming procedure, removal of bilateral breast tissue expanders with placement of silicone implants., scheduled for 10/29/18 with Dr. Marla Roe.  She also asked about removal of right axillary/breast excess tissue after removal of axillary mass during prior operation.   Prescription sent to pharmacy. Covid test scheduled.  The risks that can be encountered with and after placement of a breast implant placement were discussed and include the following but not limited to these: bleeding, infection, delayed healing, anesthesia risks, skin sensation changes, injury to  structures including nerves, blood vessels, and muscles which may be temporary or permanent, allergies to tape, suture materials and glues, blood products, topical preparations or injected agents, skin contour irregularities, skin discoloration and swelling, deep vein thrombosis, cardiac and pulmonary complications, pain, which may persist, fluid accumulation, wrinkling of the skin over the implant, changes in nipple or breast sensation, implant leakage or rupture, faulty position of the implant, persistent pain, formation of tight scar tissue around the implant (capsular contracture), possible need for revisional surgery or staged procedures.    Risks, benefits, and alternatives of procedure discussed, questions answered and consent obtained.     Electronically signed by: Carola Rhine Daiel Strohecker, PA-C 10/12/2018 12:07 PM

## 2018-10-22 ENCOUNTER — Other Ambulatory Visit: Payer: Self-pay

## 2018-10-22 ENCOUNTER — Encounter (HOSPITAL_BASED_OUTPATIENT_CLINIC_OR_DEPARTMENT_OTHER): Payer: Self-pay | Admitting: *Deleted

## 2018-10-25 ENCOUNTER — Other Ambulatory Visit (HOSPITAL_COMMUNITY)
Admission: RE | Admit: 2018-10-25 | Discharge: 2018-10-25 | Disposition: A | Discharge status: Home / Self Care | Payer: BC Managed Care – PPO | Source: Ambulatory Visit | Attending: Plastic Surgery | Admitting: Plastic Surgery

## 2018-10-25 DIAGNOSIS — Z01812 Encounter for preprocedural laboratory examination: Secondary | ICD-10-CM | POA: Diagnosis not present

## 2018-10-25 DIAGNOSIS — Z20828 Contact with and (suspected) exposure to other viral communicable diseases: Secondary | ICD-10-CM | POA: Insufficient documentation

## 2018-10-28 LAB — NOVEL CORONAVIRUS, NAA (HOSP ORDER, SEND-OUT TO REF LAB; TAT 18-24 HRS): SARS-CoV-2, NAA: NOT DETECTED

## 2018-10-29 ENCOUNTER — Ambulatory Visit (HOSPITAL_BASED_OUTPATIENT_CLINIC_OR_DEPARTMENT_OTHER): Payer: BC Managed Care – PPO | Admitting: Anesthesiology

## 2018-10-29 ENCOUNTER — Encounter (HOSPITAL_BASED_OUTPATIENT_CLINIC_OR_DEPARTMENT_OTHER): Payer: Self-pay

## 2018-10-29 ENCOUNTER — Other Ambulatory Visit: Payer: Self-pay

## 2018-10-29 ENCOUNTER — Ambulatory Visit (HOSPITAL_BASED_OUTPATIENT_CLINIC_OR_DEPARTMENT_OTHER)
Admission: RE | Admit: 2018-10-29 | Discharge: 2018-10-29 | Disposition: A | Discharge status: Home / Self Care | Payer: BC Managed Care – PPO | Attending: Plastic Surgery | Admitting: Plastic Surgery

## 2018-10-29 ENCOUNTER — Encounter (HOSPITAL_BASED_OUTPATIENT_CLINIC_OR_DEPARTMENT_OTHER)
Admission: RE | Disposition: A | Discharge status: Home / Self Care | Payer: Self-pay | Source: Home / Self Care | Attending: Plastic Surgery

## 2018-10-29 DIAGNOSIS — Z9013 Acquired absence of bilateral breasts and nipples: Secondary | ICD-10-CM | POA: Diagnosis not present

## 2018-10-29 DIAGNOSIS — E039 Hypothyroidism, unspecified: Secondary | ICD-10-CM | POA: Insufficient documentation

## 2018-10-29 DIAGNOSIS — Z45811 Encounter for adjustment or removal of right breast implant: Secondary | ICD-10-CM | POA: Insufficient documentation

## 2018-10-29 DIAGNOSIS — Z79890 Hormone replacement therapy (postmenopausal): Secondary | ICD-10-CM | POA: Diagnosis not present

## 2018-10-29 DIAGNOSIS — Z421 Encounter for breast reconstruction following mastectomy: Secondary | ICD-10-CM | POA: Diagnosis not present

## 2018-10-29 DIAGNOSIS — D1739 Benign lipomatous neoplasm of skin and subcutaneous tissue of other sites: Secondary | ICD-10-CM | POA: Diagnosis not present

## 2018-10-29 DIAGNOSIS — Z888 Allergy status to other drugs, medicaments and biological substances status: Secondary | ICD-10-CM | POA: Diagnosis not present

## 2018-10-29 DIAGNOSIS — J309 Allergic rhinitis, unspecified: Secondary | ICD-10-CM | POA: Insufficient documentation

## 2018-10-29 DIAGNOSIS — Z45812 Encounter for adjustment or removal of left breast implant: Secondary | ICD-10-CM | POA: Diagnosis not present

## 2018-10-29 DIAGNOSIS — Z853 Personal history of malignant neoplasm of breast: Secondary | ICD-10-CM | POA: Diagnosis not present

## 2018-10-29 DIAGNOSIS — L905 Scar conditions and fibrosis of skin: Secondary | ICD-10-CM | POA: Diagnosis not present

## 2018-10-29 DIAGNOSIS — E65 Localized adiposity: Secondary | ICD-10-CM | POA: Diagnosis not present

## 2018-10-29 DIAGNOSIS — C50911 Malignant neoplasm of unspecified site of right female breast: Secondary | ICD-10-CM | POA: Diagnosis not present

## 2018-10-29 DIAGNOSIS — Z885 Allergy status to narcotic agent status: Secondary | ICD-10-CM | POA: Insufficient documentation

## 2018-10-29 DIAGNOSIS — Z880 Allergy status to penicillin: Secondary | ICD-10-CM | POA: Insufficient documentation

## 2018-10-29 DIAGNOSIS — D1779 Benign lipomatous neoplasm of other sites: Secondary | ICD-10-CM | POA: Diagnosis not present

## 2018-10-29 HISTORY — PX: LIPOMA EXCISION: SHX5283

## 2018-10-29 HISTORY — PX: REMOVAL OF BILATERAL TISSUE EXPANDERS WITH PLACEMENT OF BILATERAL BREAST IMPLANTS: SHX6431

## 2018-10-29 SURGERY — REMOVAL, TISSUE EXPANDER, BREAST, BILATERAL, WITH BILATERAL IMPLANT IMPLANT INSERTION
Anesthesia: General | Site: Breast | Laterality: Right

## 2018-10-29 MED ORDER — DIPHENHYDRAMINE HCL 50 MG/ML IJ SOLN
INTRAMUSCULAR | Status: DC | PRN
  Administered 2018-10-29: 12.5 mg via INTRAVENOUS

## 2018-10-29 MED ORDER — LACTATED RINGERS IV SOLN
INTRAVENOUS | Status: DC
  Administered 2018-10-29 (×2): via INTRAVENOUS

## 2018-10-29 MED ORDER — KETOROLAC TROMETHAMINE 30 MG/ML IJ SOLN: 30.0000 mg | Freq: Once | INTRAMUSCULAR | Status: DC | PRN

## 2018-10-29 MED ORDER — SODIUM CHLORIDE 0.9% FLUSH: 3.0000 mL | Freq: Two times a day (BID) | INTRAVENOUS | Status: DC

## 2018-10-29 MED ORDER — PROPOFOL 10 MG/ML IV BOLUS
INTRAVENOUS | Status: DC | PRN
  Administered 2018-10-29: 200 mg via INTRAVENOUS

## 2018-10-29 MED ORDER — ACETAMINOPHEN 650 MG RE SUPP: 650.0000 mg | RECTAL | Status: DC | PRN

## 2018-10-29 MED ORDER — FENTANYL CITRATE (PF) 100 MCG/2ML IJ SOLN
INTRAMUSCULAR | Status: AC
  Filled 2018-10-29: qty 2

## 2018-10-29 MED ORDER — LIDOCAINE 2% (20 MG/ML) 5 ML SYRINGE
INTRAMUSCULAR | Status: AC
  Filled 2018-10-29: qty 5

## 2018-10-29 MED ORDER — CHLORHEXIDINE GLUCONATE CLOTH 2 % EX PADS: 6.0000 | MEDICATED_PAD | Freq: Once | CUTANEOUS | Status: DC

## 2018-10-29 MED ORDER — MIDAZOLAM HCL 2 MG/2ML IJ SOLN
INTRAMUSCULAR | Status: AC
  Filled 2018-10-29: qty 2

## 2018-10-29 MED ORDER — CIPROFLOXACIN IN D5W 400 MG/200ML IV SOLN
400.0000 mg | INTRAVENOUS | Status: AC
  Administered 2018-10-29: 400 mg via INTRAVENOUS

## 2018-10-29 MED ORDER — FENTANYL CITRATE (PF) 100 MCG/2ML IJ SOLN
50.0000 ug | INTRAMUSCULAR | Status: AC | PRN
  Administered 2018-10-29 (×4): 50 ug via INTRAVENOUS

## 2018-10-29 MED ORDER — MIDAZOLAM HCL 2 MG/2ML IJ SOLN
1.0000 mg | INTRAMUSCULAR | Status: DC | PRN
  Administered 2018-10-29: 2 mg via INTRAVENOUS

## 2018-10-29 MED ORDER — FAMOTIDINE 20 MG PO TABS
ORAL_TABLET | ORAL | Status: AC
  Filled 2018-10-29: qty 1

## 2018-10-29 MED ORDER — ONDANSETRON HCL 4 MG/2ML IJ SOLN
INTRAMUSCULAR | Status: AC
  Filled 2018-10-29: qty 2

## 2018-10-29 MED ORDER — ACETAMINOPHEN 500 MG PO TABS
1000.0000 mg | ORAL_TABLET | Freq: Once | ORAL | Status: AC
  Administered 2018-10-29: 1000 mg via ORAL

## 2018-10-29 MED ORDER — FAMOTIDINE 20 MG PO TABS
20.0000 mg | ORAL_TABLET | Freq: Once | ORAL | Status: AC
  Administered 2018-10-29: 20 mg via ORAL

## 2018-10-29 MED ORDER — ACETAMINOPHEN 500 MG PO TABS
ORAL_TABLET | ORAL | Status: AC
  Filled 2018-10-29: qty 2

## 2018-10-29 MED ORDER — SODIUM CHLORIDE 0.9% FLUSH: 3.0000 mL | INTRAVENOUS | Status: DC | PRN

## 2018-10-29 MED ORDER — DEXAMETHASONE SODIUM PHOSPHATE 4 MG/ML IJ SOLN
INTRAMUSCULAR | Status: DC | PRN
  Administered 2018-10-29: 5 mg via INTRAVENOUS

## 2018-10-29 MED ORDER — CIPROFLOXACIN IN D5W 400 MG/200ML IV SOLN
INTRAVENOUS | Status: DC | PRN
  Administered 2018-10-29: 400 mg via INTRAVENOUS

## 2018-10-29 MED ORDER — SODIUM CHLORIDE 0.9 % IV SOLN
INTRAVENOUS | Status: DC | PRN
  Administered 2018-10-29: 500 mL

## 2018-10-29 MED ORDER — SCOPOLAMINE 1 MG/3DAYS TD PT72
MEDICATED_PATCH | TRANSDERMAL | Status: AC
  Filled 2018-10-29: qty 1

## 2018-10-29 MED ORDER — ACETAMINOPHEN 325 MG PO TABS: 650.0000 mg | ORAL_TABLET | ORAL | Status: DC | PRN

## 2018-10-29 MED ORDER — SCOPOLAMINE 1 MG/3DAYS TD PT72
1.0000 | MEDICATED_PATCH | TRANSDERMAL | Status: DC
  Administered 2018-10-29: 1.5 mg via TRANSDERMAL

## 2018-10-29 MED ORDER — SODIUM CHLORIDE 0.9 % IV SOLN: 250.0000 mL | INTRAVENOUS | Status: DC | PRN

## 2018-10-29 MED ORDER — LIDOCAINE HCL (CARDIAC) PF 100 MG/5ML IV SOSY
PREFILLED_SYRINGE | INTRAVENOUS | Status: DC | PRN
  Administered 2018-10-29: 50 mg via INTRAVENOUS

## 2018-10-29 MED ORDER — HYDROMORPHONE HCL 1 MG/ML IJ SOLN: 0.2500 mg | INTRAMUSCULAR | Status: DC | PRN

## 2018-10-29 MED ORDER — SUCCINYLCHOLINE CHLORIDE 200 MG/10ML IV SOSY
PREFILLED_SYRINGE | INTRAVENOUS | Status: AC
  Filled 2018-10-29: qty 10

## 2018-10-29 MED ORDER — CIPROFLOXACIN IN D5W 400 MG/200ML IV SOLN
INTRAVENOUS | Status: AC
  Filled 2018-10-29: qty 200

## 2018-10-29 MED ORDER — FENTANYL CITRATE (PF) 100 MCG/2ML IJ SOLN: 25.0000 ug | INTRAMUSCULAR | Status: DC | PRN

## 2018-10-29 MED ORDER — LIDOCAINE-EPINEPHRINE 1 %-1:100000 IJ SOLN
INTRAMUSCULAR | Status: DC | PRN
  Administered 2018-10-29: 20 mL

## 2018-10-29 MED ORDER — PROMETHAZINE HCL 25 MG/ML IJ SOLN: 6.2500 mg | INTRAMUSCULAR | Status: DC | PRN

## 2018-10-29 MED ORDER — DEXAMETHASONE SODIUM PHOSPHATE 10 MG/ML IJ SOLN
INTRAMUSCULAR | Status: AC
  Filled 2018-10-29: qty 1

## 2018-10-29 SURGICAL SUPPLY — 70 items
BAG DECANTER FOR FLEXI CONT (MISCELLANEOUS) ×3 IMPLANT
BINDER BREAST LRG (GAUZE/BANDAGES/DRESSINGS) IMPLANT
BINDER BREAST MEDIUM (GAUZE/BANDAGES/DRESSINGS) IMPLANT
BINDER BREAST XLRG (GAUZE/BANDAGES/DRESSINGS) ×3 IMPLANT
BINDER BREAST XXLRG (GAUZE/BANDAGES/DRESSINGS) IMPLANT
BIOPATCH RED 1 DISK 7.0 (GAUZE/BANDAGES/DRESSINGS) IMPLANT
BLADE HEX COATED 2.75 (ELECTRODE) ×3 IMPLANT
BLADE SURG 15 STRL LF DISP TIS (BLADE) ×4 IMPLANT
BLADE SURG 15 STRL SS (BLADE) ×2
CANISTER SUCT 1200ML W/VALVE (MISCELLANEOUS) ×3 IMPLANT
CHLORAPREP W/TINT 26 (MISCELLANEOUS) ×6 IMPLANT
CORD BIPOLAR FORCEPS 12FT (ELECTRODE) IMPLANT
COVER BACK TABLE REUSABLE LG (DRAPES) ×3 IMPLANT
COVER MAYO STAND REUSABLE (DRAPES) ×3 IMPLANT
COVER WAND RF STERILE (DRAPES) IMPLANT
DECANTER SPIKE VIAL GLASS SM (MISCELLANEOUS) IMPLANT
DERMABOND ADVANCED (GAUZE/BANDAGES/DRESSINGS) ×2
DERMABOND ADVANCED .7 DNX12 (GAUZE/BANDAGES/DRESSINGS) ×4 IMPLANT
DRAIN CHANNEL 19F RND (DRAIN) IMPLANT
DRAPE LAPAROSCOPIC ABDOMINAL (DRAPES) ×3 IMPLANT
DRSG PAD ABDOMINAL 8X10 ST (GAUZE/BANDAGES/DRESSINGS) ×9 IMPLANT
ELECT BLADE 4.0 EZ CLEAN MEGAD (MISCELLANEOUS) ×3
ELECT REM PT RETURN 9FT ADLT (ELECTROSURGICAL) ×3
ELECTRODE BLDE 4.0 EZ CLN MEGD (MISCELLANEOUS) ×2 IMPLANT
ELECTRODE REM PT RTRN 9FT ADLT (ELECTROSURGICAL) ×2 IMPLANT
EVACUATOR SILICONE 100CC (DRAIN) IMPLANT
GAUZE SPONGE 4X4 12PLY STRL LF (GAUZE/BANDAGES/DRESSINGS) IMPLANT
GLOVE BIO SURGEON STRL SZ 6.5 (GLOVE) ×12 IMPLANT
GLOVE BIO SURGEON STRL SZ7 (GLOVE) ×3 IMPLANT
GLOVE BIOGEL PI IND STRL 6.5 (GLOVE) ×2 IMPLANT
GLOVE BIOGEL PI IND STRL 7.0 (GLOVE) ×2 IMPLANT
GLOVE BIOGEL PI INDICATOR 6.5 (GLOVE) ×1
GLOVE BIOGEL PI INDICATOR 7.0 (GLOVE) ×1
GLOVE ECLIPSE 6.5 STRL STRAW (GLOVE) ×3 IMPLANT
GOWN STRL REUS W/ TWL LRG LVL3 (GOWN DISPOSABLE) ×8 IMPLANT
GOWN STRL REUS W/TWL LRG LVL3 (GOWN DISPOSABLE) ×4
IMPL BREAST SMOOTH UH 430CC (Breast) ×4 IMPLANT
IMPLANT BREAST SMOOTH UH 430CC (Breast) ×6 IMPLANT
IV NS 1000ML (IV SOLUTION)
IV NS 1000ML BAXH (IV SOLUTION) IMPLANT
IV NS 500ML (IV SOLUTION)
IV NS 500ML BAXH (IV SOLUTION) IMPLANT
KIT FILL SYSTEM UNIVERSAL (SET/KITS/TRAYS/PACK) IMPLANT
NDL SAFETY ECLIPSE 18X1.5 (NEEDLE) ×2 IMPLANT
NEEDLE HYPO 18GX1.5 SHARP (NEEDLE) ×1
NEEDLE HYPO 25X1 1.5 SAFETY (NEEDLE) ×3 IMPLANT
PACK BASIN DAY SURGERY FS (CUSTOM PROCEDURE TRAY) ×3 IMPLANT
PENCIL BUTTON HOLSTER BLD 10FT (ELECTRODE) ×3 IMPLANT
PIN SAFETY STERILE (MISCELLANEOUS) IMPLANT
SIZER BREAST GEL REUSE 430CC (SIZER) ×3
SIZER BREAST REUSE 425CC (SIZER) ×3
SIZER BRST GEL REUSE 430CC (SIZER) ×2 IMPLANT
SIZER BRST REUSE 425CC (SIZER) ×2 IMPLANT
SLEEVE SCD COMPRESS KNEE MED (MISCELLANEOUS) ×3 IMPLANT
SPONGE LAP 18X18 RF (DISPOSABLE) ×6 IMPLANT
STRIP SUTURE WOUND CLOSURE 1/2 (SUTURE) IMPLANT
SUT MNCRL AB 4-0 PS2 18 (SUTURE) ×12 IMPLANT
SUT MON AB 3-0 SH 27 (SUTURE) ×4
SUT MON AB 3-0 SH27 (SUTURE) ×8 IMPLANT
SUT MON AB 5-0 PS2 18 (SUTURE) ×9 IMPLANT
SUT PDS AB 2-0 CT2 27 (SUTURE) IMPLANT
SUT VIC AB 3-0 SH 27 (SUTURE)
SUT VIC AB 3-0 SH 27X BRD (SUTURE) IMPLANT
SUT VICRYL 4-0 PS2 18IN ABS (SUTURE) IMPLANT
SYR BULB IRRIGATION 50ML (SYRINGE) ×3 IMPLANT
SYR CONTROL 10ML LL (SYRINGE) ×3 IMPLANT
TOWEL GREEN STERILE FF (TOWEL DISPOSABLE) ×6 IMPLANT
TUBE CONNECTING 20X1/4 (TUBING) ×3 IMPLANT
UNDERPAD 30X36 HEAVY ABSORB (UNDERPADS AND DIAPERS) ×6 IMPLANT
YANKAUER SUCT BULB TIP NO VENT (SUCTIONS) ×3 IMPLANT

## 2018-10-29 NOTE — Interval H&P Note (Signed)
History and Physical Interval Note:  10/29/2018 11:34 AM  Kaitlin Patterson  has presented today for surgery, with the diagnosis of Malignant Neoplasm Of Right Female Breast, Unspecified Estrogen Receptor Status, Unspecified Site Of Breast.  The various methods of treatment have been discussed with the patient and family. After consideration of risks, benefits and other options for treatment, the patient has consented to  Procedure(s) with comments: Removal of bilateral breast expanders and placement of silicone implants (Bilateral) - 2.5 hours as a surgical intervention.  The patient's history has been reviewed, patient examined, no change in status, stable for surgery.  I have reviewed the patient's chart and labs.  Questions were answered to the patient's satisfaction.     Kaitlin Patterson

## 2018-10-29 NOTE — Anesthesia Postprocedure Evaluation (Signed)
Anesthesia Post Note  Patient: Technical sales engineer  Procedure(s) Performed: Removal of bilateral breast expanders and placement of silicone implants (Bilateral Breast) EXCISION RIGHT AXILLARY LIPOMA (Right Axilla)     Patient location during evaluation: PACU Anesthesia Type: General Level of consciousness: awake and alert Pain management: pain level controlled Vital Signs Assessment: post-procedure vital signs reviewed and stable Respiratory status: spontaneous breathing, nonlabored ventilation, respiratory function stable and patient connected to nasal cannula oxygen Cardiovascular status: blood pressure returned to baseline and stable Postop Assessment: no apparent nausea or vomiting Anesthetic complications: no    Last Vitals:  Vitals:   10/29/18 1615 10/29/18 1632  BP: 115/68 119/83  Pulse: 81 81  Resp: 14 18  Temp:  37.3 C  SpO2: 100% 99%    Last Pain:  Vitals:   10/29/18 1632  TempSrc: Oral  PainSc: 0-No pain                 Ryan P Ellender

## 2018-10-29 NOTE — Anesthesia Preprocedure Evaluation (Addendum)
Anesthesia Evaluation  Patient identified by MRN, date of birth, ID band Patient awake    Reviewed: Allergy & Precautions, NPO status , Patient's Chart, lab work & pertinent test results  History of Anesthesia Complications (+) PONV and history of anesthetic complications  Airway Mallampati: I  TM Distance: >3 FB Neck ROM: Full    Dental no notable dental hx.    Pulmonary neg pulmonary ROS,    Pulmonary exam normal breath sounds clear to auscultation       Cardiovascular negative cardio ROS Normal cardiovascular exam Rhythm:Regular Rate:Normal     Neuro/Psych negative neurological ROS  negative psych ROS   GI/Hepatic negative GI ROS, Neg liver ROS,   Endo/Other  Hypothyroidism   Renal/GU negative Renal ROS     Musculoskeletal negative musculoskeletal ROS (+)   Abdominal   Peds  Hematology negative hematology ROS (+)   Anesthesia Other Findings Malignant Neoplasm Of Right Female Breast, Unspecified Estrogen Receptor Status, Unspecified Site Of Breast  Reproductive/Obstetrics                            Anesthesia Physical Anesthesia Plan  ASA: II  Anesthesia Plan: General   Post-op Pain Management:    Induction: Intravenous  PONV Risk Score and Plan: 4 or greater and Scopolamine patch - Pre-op, Midazolam, Dexamethasone, Ondansetron and Treatment may vary due to age or medical condition  Airway Management Planned: LMA  Additional Equipment:   Intra-op Plan:   Post-operative Plan: Extubation in OR  Informed Consent: I have reviewed the patients History and Physical, chart, labs and discussed the procedure including the risks, benefits and alternatives for the proposed anesthesia with the patient or authorized representative who has indicated his/her understanding and acceptance.     Dental advisory given  Plan Discussed with: CRNA  Anesthesia Plan Comments:          Anesthesia Quick Evaluation

## 2018-10-29 NOTE — Transfer of Care (Signed)
Immediate Anesthesia Transfer of Care Note  Patient: Kaitlin Patterson  Procedure(s) Performed: Removal of bilateral breast expanders and placement of silicone implants (Bilateral Breast)  Patient Location: PACU  Anesthesia Type:General  Level of Consciousness: sedated  Airway & Oxygen Therapy: Patient Spontanous Breathing and Patient connected to face mask oxygen  Post-op Assessment: Report given to RN and Post -op Vital signs reviewed and stable  Post vital signs: Reviewed and stable  Last Vitals:  Vitals Value Taken Time  BP    Temp    Pulse 96 10/29/18 1553  Resp 12 10/29/18 1553  SpO2 100 % 10/29/18 1553  Vitals shown include unvalidated device data.  Last Pain:  Vitals:   10/29/18 1137  TempSrc: Oral  PainSc: 0-No pain         Complications: No apparent anesthesia complications

## 2018-10-29 NOTE — Discharge Instructions (Signed)
INSTRUCTIONS FOR AFTER BREAST SURGERY   You are getting ready to undergo breast surgery.  You will likely have some questions about what to expect following your operation.  The following information will help you and your family understand what to expect when you are discharged from the hospital.  Following these guidelines will help ensure a smooth recovery and reduce risks of complications.   Postoperative instructions include information on: diet, wound care, medications and physical activity.  AFTER SURGERY Expect to go home after the procedure.  In some cases, you may need to spend one night in the hospital for observation.  DIET Breast surgery does not require a specific diet.  However, I have to mention that the healthier you eat the better your body can start healing. It is important to increasing your protein intake.  This means limiting the foods with sugar and carbohydrates.  Focus on vegetables and some meat.  If you have any liposuction during your procedure be sure to drink water.  If your urine is bright yellow, then it is concentrated, and you need to drink more water.  As a general rule after surgery, you should have 8 ounces of water every hour while awake.  If you find you are persistently nauseated or unable to take in liquids let us know.  NO TOBACCO USE or EXPOSURE.  This will slow your healing process and increase the risk of a wound.  WOUND CARE If you don't have a drain:  You can shower the day after surgery. Use fragrance free soap.  Dial, [place] and [place] are usually mild on the skin. If you have a drain: You can shower five days after surgery.  Clean with baby wipes until the drain is removed.    If you have steri-strips / tape directly attached to your skin leave them in place. It is OK to get these wet.  No baths, pools or hot tubs for two weeks. We close your incision to leave the smallest and best-looking scar. No ointment or creams on your incisions until given the go  ahead.  Especially not Neosporin (Too many skin reactions with this one).  A few weeks after surgery you can use Mederma and start massaging the scar. We ask you to wear your binder or sports bra for the first 6 weeks around the clock, including while sleeping. This provides added comfort and helps reduce the fluid accumulation at the surgery site.  ACTIVITY No heavy lifting until cleared by the doctor.  This usually means no more than a half-gallon of milk.  It is OK to walk and climb stairs. In fact, moving your legs is very important to decrease your risk of a blood clot.  It will also help keep you from getting deconditioned.  Every 1 to 2 hours get up and walk for 5 minutes. This will help with a quicker recovery back to normal.  Let pain be your guide so you don't do too much.  NO, you cannot do the spring cleaning and don't plan on taking care of anyone else.  This is your time for TLC.  You will be more comfortable if you sleep and rest with your head elevated either with a few pillows under you or in a recliner.  No stomach sleeping for a few months.  WORK Everyone returns to work at different times. As a rough guide, most people take at least 1 - 2 weeks off prior to returning to work. If you need documentation  for your job, bring the forms to your postoperative follow up visit.  DRIVING Arrange for someone to bring you home from the hospital.  You may be able to drive a few days after surgery but not while taking any narcotics or valium.  BOWEL MOVEMENTS Constipation can occur after anesthesia and while taking pain medication.  It is important to stay ahead for your comfort.  We recommend taking Milk of Magnesia (2 tablespoons; twice a day) while taking the pain pills.  SEROMA This is fluid your body tried to put in the surgical site.  This is normal but if it creates tight skinny skin let us know.  It usually decreases in a few weeks.  WHEN TO CALL Call your surgeon's office if any of  the following occur:  Fever 101 degrees F or greater  Excessive bleeding or fluid from the incision site.  Pain that increases over time without aid from the medications  Redness, warmth, or pus draining from incision sites  Persistent nausea or inability to take in liquids  Severe misshapen area that underwent the operation.   Post Anesthesia Home Care Instructions  Activity: Get plenty of rest for the remainder of the day. A responsible individual must stay with you for 24 hours following the procedure.  For the next 24 hours, DO NOT: -Drive a car -Paediatric nurse -Drink alcoholic beverages -Take any medication unless instructed by your physician -Make any legal decisions or sign important papers.  Meals: Start with liquid foods such as gelatin or soup. Progress to regular foods as tolerated. Avoid greasy, spicy, heavy foods. If nausea and/or vomiting occur, drink only clear liquids until the nausea and/or vomiting subsides. Call your physician if vomiting continues.  Special Instructions/Symptoms: Your throat may feel dry or sore from the anesthesia or the breathing tube placed in your throat during surgery. If this causes discomfort, gargle with warm salt water. The discomfort should disappear within 24 hours.  If you had a scopolamine patch placed behind your ear for the management of post- operative nausea and/or vomiting:  1. The medication in the patch is effective for 72 hours, after which it should be removed.  Wrap patch in a tissue and discard in the trash. Wash hands thoroughly with soap and water. 2. You may remove the patch earlier than 72 hours if you experience unpleasant side effects which may include dry mouth, dizziness or visual disturbances. 3. Avoid touching the patch. Wash your hands with soap and water after contact with the patch.

## 2018-10-29 NOTE — Anesthesia Procedure Notes (Signed)
Procedure Name: LMA Insertion Date/Time: 10/29/2018 1:39 PM Performed by: Willa Frater, CRNA Pre-anesthesia Checklist: Patient identified, Emergency Drugs available, Suction available and Patient being monitored Patient Re-evaluated:Patient Re-evaluated prior to induction Oxygen Delivery Method: Circle system utilized Preoxygenation: Pre-oxygenation with 100% oxygen Induction Type: IV induction Ventilation: Mask ventilation without difficulty LMA: LMA inserted LMA Size: 4.0 Number of attempts: 1 Airway Equipment and Method: Bite block Placement Confirmation: positive ETCO2 Tube secured with: Tape Dental Injury: Teeth and Oropharynx as per pre-operative assessment

## 2018-10-29 NOTE — Op Note (Signed)
Op report Bilateral Exchange   DATE OF OPERATION: 10/29/2018  LOCATION: [place]  SURGICAL DIVISION: Plastic Surgery  PREOPERATIVE DIAGNOSES:  1.History of breast cancer.  2. Acquired absence of bilateral breast.  3. Right axillary lipoma.  POSTOPERATIVE DIAGNOSES:  1. History of breast cancer.  2. Acquired absence of bilateral breast.  3. Right axillary lipoma.  PROCEDURE:  1. Bilateral exchange of tissue expanders for implants.  2. Bilateral capsulotomies for implant respositioning. 3. Excision of right axillary lipoma 4 x 4 cm.  SURGEON: Norissa Bartee Sanger Danyale Ridinger, DO  ASSISTANT: Roetta Sessions, PA  ANESTHESIA:  General.   COMPLICATIONS: None.   IMPLANTS: Left - Mentor Smooth Round Ultra High Profile Gel 430cc. Ref [id number].  Serial Number [id number] Right - Mentor Smooth Round Ultra High Profile Gel 430cc. Ref [id number].  Serial Number [id number]  INDICATIONS FOR PROCEDURE:  The patient, Kaitlin Patterson, is a 43 y.o. female born on Feb 02, 1975, is here for treatment after bilateral mastectomies.  She had tissue expanders placed at the time of mastectomies. She now presents for exchange of her expanders for implants.  She requires capsulotomies to better position the implants. MRN: [id number]  CONSENT:  Informed consent was obtained directly from the patient. Risks, benefits and alternatives were fully discussed. Specific risks including but not limited to bleeding, infection, hematoma, seroma, scarring, pain, implant infection, implant extrusion, capsular contracture, asymmetry, wound healing problems, and need for further surgery were all discussed. The patient did have an ample opportunity to have her questions answered to her satisfaction.   DESCRIPTION OF PROCEDURE:  The patient was taken to the operating room. SCDs were placed and IV antibiotics were given. The patient's chest was prepped and draped in a sterile fashion. A time out was  performed and the implants to be used were identified.    On the right breast: One percent Lidocaine with epinephrine was used to infiltrate at the incision site. The old mastectomy scar was excised.  The mastectomy flaps from the superior and inferior flaps were raised over the pectoralis major muscle for several centimeters to minimize tension for the closure. The pectoralis was split inferior to the skin incision to expose and remove the tissue expander.  Inspection of the pocket showed a normal healthy capsule and good integration of the biologic matrix.  The pocket was irrigated with antibiotic solution.  Circumferential capsulotomies were performed to allow for breast pocket expansion.  Measurements were made and a sizer used to confirm adequate pocket size for the implant dimensions.  Hemostasis was ensured with electrocautery. New gloves were placed. The implant was soaked in antibiotic solution and then placed in the pocket and oriented appropriately. The pectoralis major muscle and capsule on the anterior surface were re-closed with a 3-0 Monocryl suture. The remaining skin was closed with 4-0 Monocryl deep dermal and 5-0 Monocryl subcuticular stitches.   On the left breast: The old mastectomy scar was excised.  The mastectomy flaps from the superior and inferior flaps were raised over the pectoralis major muscle for several centimeters to minimize tension for the closure. The pectoralis was split inferior to the skin incision to expose and remove the tissue expander.  Inspection of the pocket showed a normal healthy capsule and good integration of the biologic matrix.   Circumferential capsulotomies were performed to allow for breast pocket expansion.  Measurements were made and a sizer utilized to confirm adequate pocket size for the implant dimensions.  Hemostasis was ensured with the electrocautery.  New  gloves were applied. The implant was soaked in antibiotic solution and placed in the pocket  and oriented appropriately. The pectoralis major muscle and capsule on the anterior surface were re-closed with a 3-0 Monocryl suture. The remaining skin was closed with 4-0 Monocryl deep dermal and 5-0 Monocryl subcuticular stitches.    Right Axilla lipoma:  The axillary incision was made at the site of the previous incision for the LND.  The lipoma was isolated and removed without getting the deepest portion due to the location.  Hemostasis was achieved with electrocautery.  The lipoma was 4 x 4 cm.  The deep layer was closed with the 4-0 Monocryl followed by the 5-0 Monocryl. Dermabond was applied to the incision site. A breast binder and ABDs were placed.  The patient was allowed to wake from anesthesia and taken to the recovery room in satisfactory condition.   The advanced practice practitioner (APP) assisted throughout the case.  The APP was essential in retraction and counter traction when needed to make the case progress smoothly.  This retraction and assistance made it possible to see the tissue plans for the procedure.  The assistance was needed for blood control, tissue re-approximation and assisted with closure of the incision site.

## 2018-10-29 NOTE — Interval H&P Note (Signed)
History and Physical Interval Note:  10/29/2018 11:43 AM  Kaitlin Patterson  has presented today for surgery, with the diagnosis of Malignant Neoplasm Of Right Female Breast, Unspecified Estrogen Receptor Status, Unspecified Site Of Breast.  The various methods of treatment have been discussed with the patient and family. After consideration of risks, benefits and other options for treatment, the patient has consented to  Procedure(s) with comments: Removal of bilateral breast expanders and placement of silicone implants (Bilateral) - 2.5 hours as a surgical intervention.  The patient's history has been reviewed, patient examined, no change in status, stable for surgery.  I have reviewed the patient's chart and labs.  Questions were answered to the patient's satisfaction.     [name] Risk

## 2018-10-30 ENCOUNTER — Encounter (HOSPITAL_BASED_OUTPATIENT_CLINIC_OR_DEPARTMENT_OTHER): Payer: Self-pay | Admitting: Plastic Surgery

## 2018-10-31 LAB — SURGICAL PATHOLOGY

## 2018-11-05 ENCOUNTER — Telehealth: Payer: Self-pay

## 2018-11-05 NOTE — Telephone Encounter (Signed)

## 2018-11-06 ENCOUNTER — Other Ambulatory Visit: Payer: Self-pay

## 2018-11-06 ENCOUNTER — Encounter: Payer: Self-pay | Admitting: Surgical

## 2018-11-06 ENCOUNTER — Ambulatory Visit (INDEPENDENT_AMBULATORY_CARE_PROVIDER_SITE_OTHER): Payer: BC Managed Care – PPO | Admitting: Surgical

## 2018-11-06 VITALS — BP 115/53 | HR 79 | Temp 97.8°F | Ht 69.0 in | Wt 143.8 lb

## 2018-11-06 DIAGNOSIS — C50911 Malignant neoplasm of unspecified site of right female breast: Secondary | ICD-10-CM

## 2018-11-06 DIAGNOSIS — Z9013 Acquired absence of bilateral breasts and nipples: Secondary | ICD-10-CM

## 2018-11-06 NOTE — Progress Notes (Signed)
   Subjective:     Patient ID: Kaitlin Patterson, female    DOB: 12/26/1975, 43 y.o.   MRN: [id number]  Chief Complaint  Patient presents with  . Post-op Follow-up    Removal of (B) breast expanders and placement of implants    HPI: The patient is a 43 y.o. female here for follow-up after removal on bilateral breast expanders and placement of bilateral breast implants on 10/29/18.  She is doing well. She had Mentor Smooth Round Ultra High Profile Gel 430cc placed bilaterally. She is pleased with the size.   She reports having a small area on her left lateral breast that has a small lesion within or under the skin. It feels as if a possible cyst or fat necrosis. Her incisions are healing well, c/d/i. No drainage, dehiscence, swelling noted. No sign of seroma, hematoma, swelling.  No fevers, chills, n/v.  Review of Systems  Constitutional: Positive for activity change. Negative for appetite change, chills, diaphoresis, fatigue and fever.  Respiratory: Negative for cough and shortness of breath.   Cardiovascular: Negative for chest pain and leg swelling.  Gastrointestinal: Negative for diarrhea, nausea and vomiting.  Musculoskeletal: Negative for myalgias.  Skin: Negative for color change, pallor, rash and wound.  Neurological: Negative for weakness and headaches.   Objective:   Vital Signs BP (!) 115/53 (BP Location: Left Arm, Patient Position: Sitting, Cuff Size: Normal)   Pulse 79   Temp 97.8 F (36.6 C) (Temporal)   Ht 5\' 9"  (1.753 m)   Wt 143 lb 12.8 oz (65.2 kg)   LMP 11/05/2016 (Approximate)   SpO2 100%   BMI 21.24 kg/m  Vital Signs and Nursing Note Reviewed Chaperone present Physical Exam  Constitutional: She is oriented to person, place, and time and well-developed, well-nourished, and in no distress.  HENT:  Head: Normocephalic and atraumatic.  Cardiovascular: Normal rate.  Pulmonary/Chest: Effort normal.    0.3 mm mobile mass/fat necrosis noted on left lateral breast,  just superior to transverse incision.   Musculoskeletal: Normal range of motion.  Neurological: She is alert and oriented to person, place, and time. Gait normal.  Skin: Skin is warm and dry. No rash noted. She is not diaphoretic. No erythema. No pallor.  Psychiatric: Mood and affect normal.    Assessment/Plan:     ICD-10-CM   1. Status post bilateral mastectomy  Z90.13   2. Acquired absence of breast and absent nipple, bilateral  Z90.13   3. Malignant neoplasm of right female breast, unspecified estrogen receptor status, unspecified site of breast [facility])  C50.911     Kaitlin Patterson is healing really well. She has no complaints. Sutures removed from transverse mastectomy incisions.  Continue wearing sports bra or breast binder, whichever is more comfortable for patient. Healthy eating to optimize healing. Low carb/sugar, high protein.  Small left breast cyst/fat necrosis noted laterally, massage and monitor for changes. Follow up as needed.   Follow up in 3 months. Call with questions or concerns prior to visit and we will be able to fit in for evaluation.  Carola Rhine Bryen Hinderman, PA-C 11/06/2018, 2:02 PM

## 2018-11-20 DIAGNOSIS — L820 Inflamed seborrheic keratosis: Secondary | ICD-10-CM | POA: Diagnosis not present

## 2018-11-20 DIAGNOSIS — L570 Actinic keratosis: Secondary | ICD-10-CM | POA: Diagnosis not present

## 2018-11-20 DIAGNOSIS — L209 Atopic dermatitis, unspecified: Secondary | ICD-10-CM | POA: Diagnosis not present

## 2018-11-26 ENCOUNTER — Telehealth: Payer: Self-pay

## 2018-11-26 DIAGNOSIS — Z131 Encounter for screening for diabetes mellitus: Secondary | ICD-10-CM | POA: Diagnosis not present

## 2018-11-26 DIAGNOSIS — Z01419 Encounter for gynecological examination (general) (routine) without abnormal findings: Secondary | ICD-10-CM | POA: Diagnosis not present

## 2018-11-26 DIAGNOSIS — Z6821 Body mass index (BMI) 21.0-21.9, adult: Secondary | ICD-10-CM | POA: Diagnosis not present

## 2018-11-26 DIAGNOSIS — Z13220 Encounter for screening for lipoid disorders: Secondary | ICD-10-CM | POA: Diagnosis not present

## 2018-11-26 DIAGNOSIS — Z853 Personal history of malignant neoplasm of breast: Secondary | ICD-10-CM | POA: Diagnosis not present

## 2018-11-26 DIAGNOSIS — Z1331 Encounter for screening for depression: Secondary | ICD-10-CM | POA: Diagnosis not present

## 2018-11-26 NOTE — Telephone Encounter (Signed)

## 2018-11-27 ENCOUNTER — Telehealth: Payer: Self-pay

## 2018-11-27 ENCOUNTER — Other Ambulatory Visit: Payer: Self-pay

## 2018-11-27 ENCOUNTER — Encounter: Payer: Self-pay | Admitting: Surgical

## 2018-11-27 ENCOUNTER — Ambulatory Visit (INDEPENDENT_AMBULATORY_CARE_PROVIDER_SITE_OTHER): Payer: BC Managed Care – PPO | Admitting: Surgical

## 2018-11-27 VITALS — BP 109/73 | HR 74 | Temp 97.3°F | Wt 143.6 lb

## 2018-11-27 DIAGNOSIS — Z9013 Acquired absence of bilateral breasts and nipples: Secondary | ICD-10-CM

## 2018-11-27 DIAGNOSIS — C50911 Malignant neoplasm of unspecified site of right female breast: Secondary | ICD-10-CM

## 2018-11-27 MED ORDER — DIAZEPAM 2 MG PO TABS: 2.0000 mg | ORAL_TABLET | Freq: Two times a day (BID) | ORAL | 0 refills | Status: DC | PRN

## 2018-11-27 NOTE — Progress Notes (Signed)
Kaitlin Patterson is a 43 year old female who presented today for questions in regards to her right breast implant.  She reports that she has noticed that this implant has began to move more laterally with manipulation. She reports that it is causing her some pain.  She would like to know if this is anything she should be concerned about.  She has not had any fevers, chills, nausea, vomiting.  Her incisions are very well-healed.  She has a few Monocryl sutures in place, beginning to dissolve.  On exam, she has some mild tenderness to palpation in the right axillary region. There is no swelling noted.  No erythema. Chaperone present BP 109/73 (BP Location: Left Arm, Patient Position: Sitting, Cuff Size: Normal)   Pulse 74   Temp (!) 97.3 F (36.3 C) (Temporal)   Wt 143 lb 9.6 oz (65.1 kg)   LMP 11/05/2016 (Approximate)   SpO2 100%   BMI 21.21 kg/m    A/P:  Possible muscle spasms due to implant  Recommend diazepam at night as needed to help relax muscle.  Sutures removed from transverse mastectomy incisions.  Overall Kaitlin Patterson is healing really well, call with any questions or concerns.

## 2018-11-27 NOTE — Telephone Encounter (Signed)
Company: Medical Records  Document: Signed medical records request Other records requested: None  Signed medical records request has been forwarded to HIM for them to forward records as requested. Document and fax confirmation have been placed in the faxed file for future reference.

## 2018-12-06 ENCOUNTER — Other Ambulatory Visit: Payer: Self-pay

## 2018-12-10 ENCOUNTER — Encounter: Payer: Self-pay | Admitting: Endocrinology

## 2018-12-10 ENCOUNTER — Ambulatory Visit (INDEPENDENT_AMBULATORY_CARE_PROVIDER_SITE_OTHER): Payer: BC Managed Care – PPO | Admitting: Endocrinology

## 2018-12-10 ENCOUNTER — Other Ambulatory Visit: Payer: Self-pay

## 2018-12-10 VITALS — BP 110/70 | HR 81 | Ht 69.0 in | Wt 146.0 lb

## 2018-12-10 DIAGNOSIS — E061 Subacute thyroiditis: Secondary | ICD-10-CM

## 2018-12-10 DIAGNOSIS — Z853 Personal history of malignant neoplasm of breast: Secondary | ICD-10-CM | POA: Diagnosis not present

## 2018-12-10 LAB — T4, FREE: Free T4: 0.89 ng/dL (ref 0.60–1.60)

## 2018-12-10 LAB — FOLLICLE STIMULATING HORMONE: FSH: 15.7 m[IU]/mL

## 2018-12-10 LAB — TSH: TSH: 1.39 u[IU]/mL (ref 0.35–4.50)

## 2018-12-10 NOTE — Patient Instructions (Signed)
Blood tests are requested for you today.  We'll let you know about the results.  Please come back for a follow-up appointment in 6 months.   

## 2018-12-10 NOTE — Progress Notes (Signed)
Subjective:    Patient ID: Kaitlin Patterson, female    DOB: Oct 21, 1975, 43 y.o.   MRN: [id number]  HPI Pt returns for f/u of subacute thyroiditis (dx'ed 2018; she chose tapazole rx; US showed small MNG; ENT advised against thyroidectomy; in early 2019, she developed hypothyroidism, and started synthroid).  pt reports weight gain.  Dr Jeryl Columbia requests other labs.   Past Medical History:  Diagnosis Date  . Allergic rhinitis   . Cancer ([place])    stage "0" breast cancer  . Hypothyroidism   . PONV (postoperative nausea and vomiting)     Past Surgical History:  Procedure Laterality Date  . ABDOMINAL HYSTERECTOMY  2018  . BREAST BIOPSY     x4  . BREAST RECONSTRUCTION WITH PLACEMENT OF TISSUE EXPANDER AND FLEX HD (ACELLULAR HYDRATED DERMIS) Bilateral 08/13/2018   Procedure: BREAST RECONSTRUCTION WITH PLACEMENT OF TISSUE EXPANDER AND FLEX HD (ACELLULAR HYDRATED DERMIS);  Surgeon: Wallace Going, DO;  Location: [place];  Service: Plastics;  Laterality: Bilateral;  Dr. Lilia Pro from [facility] will be the first surgeon on the case. Please allow 2.5 hours for his portion of the surgery.  . CESAREAN SECTION N/A 2016   cesarean done when patient had twins  . HYSTEROTOMY Bilateral 11/29/2016   Partial all taken but overies.   Marland Kitchen LIPOMA EXCISION Right 10/29/2018   Procedure: EXCISION RIGHT AXILLARY LIPOMA;  Surgeon: Wallace Going, DO;  Location: [facility];  Service: Plastics;  Laterality: Right;  . REMOVAL OF BILATERAL TISSUE EXPANDERS WITH PLACEMENT OF BILATERAL BREAST IMPLANTS Bilateral 10/29/2018   Procedure: Removal of bilateral breast expanders and placement of silicone implants;  Surgeon: Wallace Going, DO;  Location: [place];  Service: Plastics;  Laterality: Bilateral;  2.5 hours  . SIMPLE MASTECTOMY Bilateral 08/13/2018   WITH TISSUE EXPANDERS & RECONSTUCTION  . SIMPLE MASTECTOMY WITH AXILLARY SENTINEL NODE BIOPSY Bilateral 08/13/2018   Procedure:  BILATERAL SIMPLE MASTECTOMY AND EXCISION OF RIGHT AXILLARY MASS.;  Surgeon: Pollyann Samples, MD;  Location: [place];  Service: General;  Laterality: Bilateral;  . TONSILLECTOMY  1996  . TUBAL LIGATION  2016    Social History   Socioeconomic History  . Marital status: Married    Spouse name: Not on file  . Number of children: Not on file  . Years of education: Not on file  . Highest education level: Not on file  Occupational History  . Not on file  Social Needs  . Financial resource strain: Not on file  . Food insecurity    Worry: Not on file    Inability: Not on file  . Transportation needs    Medical: Not on file    Non-medical: Not on file  Tobacco Use  . Smoking status: Never Smoker  . Smokeless tobacco: Never Used  Substance and Sexual Activity  . Alcohol use: No    Frequency: Never  . Drug use: No  . Sexual activity: Not Currently    Partners: Male  Lifestyle  . Physical activity    Days per week: Not on file    Minutes per session: Not on file  . Stress: Not on file  Relationships  . Social Herbalist on phone: Not on file    Gets together: Not on file    Attends religious service: Not on file    Active member of club or organization: Not on file    Attends meetings of clubs or organizations: Not  on file    Relationship status: Not on file  . Intimate partner violence    Fear of current or ex partner: Not on file    Emotionally abused: Not on file    Physically abused: Not on file    Forced sexual activity: Not on file  Other Topics Concern  . Not on file  Social History Narrative  . Not on file    Current Outpatient Medications on File Prior to Visit  Medication Sig Dispense Refill  . Ascorbic Acid (VITAMIN C) 500 MG CHEW Chew 500 mg by mouth daily.    . Cetirizine HCl (ZYRTEC ALLERGY) 10 MG CAPS Take 1 capsule by mouth at bedtime.    . diazepam (VALIUM) 2 MG tablet Take 1 tablet (2 mg total) by mouth every 12 (twelve) hours as needed for  anxiety. 10 tablet 0  . HYDROQUINONE EX Apply 1 application topically at bedtime as needed (dark spots).    Marland Kitchen levothyroxine (SYNTHROID) 75 MCG tablet TAKE 1 TABLET (75 MCG TOTAL) BY MOUTH DAILY BEFORE BREAKFAST. 90 tablet 3   No current facility-administered medications on file prior to visit.      Family History  Problem Relation Age of Onset  . Hypothyroidism Mother     BP 110/70 (BP Location: Left Arm, Patient Position: Sitting, Cuff Size: Normal)   Pulse 81   Ht 5\' 9"  (1.753 m)   Wt 146 lb (66.2 kg)   LMP 11/05/2016 (Approximate)   SpO2 99%   BMI 21.56 kg/m    Review of Systems She also has fatigue--poss related to dx of breast cancer.     Objective:   Physical Exam VITAL SIGNS:  See vs page.   GENERAL: no distress.  NECK: There is no palpable thyroid enlargement.  No thyroid nodule is palpable.  No palpable lymphadenopathy at the anterior neck.    Lab Results  Component Value Date   TSH 1.39 12/10/2018      Assessment & Plan:  Hypothyroidism: well-replaced. Breast cancer: check labs   Patient Instructions  Blood tests are requested for you today.  We'll let you know about the results.  Please come back for a follow-up appointment in 6 months.

## 2018-12-14 LAB — PROGESTERONE: Progesterone: 0.6 ng/mL

## 2018-12-14 LAB — ESTRADIOL, FREE
Estradiol, Free: 3.52 pg/mL
Estradiol: 220 pg/mL

## 2018-12-17 DIAGNOSIS — R1032 Left lower quadrant pain: Secondary | ICD-10-CM | POA: Diagnosis not present

## 2018-12-19 DIAGNOSIS — R1310 Dysphagia, unspecified: Secondary | ICD-10-CM | POA: Diagnosis not present

## 2018-12-21 DIAGNOSIS — Z1211 Encounter for screening for malignant neoplasm of colon: Secondary | ICD-10-CM | POA: Diagnosis not present

## 2018-12-21 DIAGNOSIS — K648 Other hemorrhoids: Secondary | ICD-10-CM | POA: Diagnosis not present

## 2018-12-21 DIAGNOSIS — Z800 Family history of malignant neoplasm of digestive organs: Secondary | ICD-10-CM | POA: Diagnosis not present

## 2019-01-07 ENCOUNTER — Ambulatory Visit: Payer: BC Managed Care – PPO | Admitting: Endocrinology

## 2019-02-07 ENCOUNTER — Other Ambulatory Visit: Payer: Self-pay

## 2019-02-07 ENCOUNTER — Encounter: Payer: Self-pay | Admitting: Surgical

## 2019-02-07 ENCOUNTER — Ambulatory Visit (INDEPENDENT_AMBULATORY_CARE_PROVIDER_SITE_OTHER): Payer: BC Managed Care – PPO | Admitting: Surgical

## 2019-02-07 VITALS — BP 104/72 | HR 77 | Temp 99.3°F | Ht 69.0 in | Wt 142.2 lb

## 2019-02-07 DIAGNOSIS — Z9013 Acquired absence of bilateral breasts and nipples: Secondary | ICD-10-CM

## 2019-02-07 DIAGNOSIS — C50911 Malignant neoplasm of unspecified site of right female breast: Secondary | ICD-10-CM | POA: Diagnosis not present

## 2019-02-07 NOTE — Progress Notes (Signed)
   Subjective:     Patient ID: Kaitlin Patterson, female    DOB: May 19, 1975, 44 y.o.   MRN: [id number]  Chief Complaint  Patient presents with  . Follow-up    Patient here for 3 months follow up from [facility]: 10/29/18 removal of breast expanders with replacement of silicone implants    HPI: The patient is a 44 y.o. female here for follow-up after removal of bilateral breast expanders with placement of silicone implants on [id number] with Dr. Marla Roe.  Kaitlin Patterson is doing really well today.  She is very happy with the size of her implants and her overall outcome.  She does have some questions about the right medial breast having a hard area just deep to the most medial incision.   Her incisions are well-healed.  Her implants have begun to settle into place.  She reports having some pain if she lays on her side for too long and the left implant moves slightly towards her armpit, but it is manageable.  She is interested in nipple areola tattooing. No other complaints.  Review of Systems  Constitutional: Negative.   Respiratory: Negative.   Cardiovascular: Negative.   Gastrointestinal: Negative.   Musculoskeletal: Negative.   Neurological: Negative.      Objective:   Vital Signs BP 104/72 (BP Location: Left Arm, Patient Position: Sitting, Cuff Size: Normal)   Pulse 77   Temp 99.3 F (37.4 C) (Temporal)   Ht 5\' 9"  (1.753 m)   Wt 142 lb 3.2 oz (64.5 kg)   LMP 11/05/2016 (Approximate)   SpO2 100%   BMI 21.00 kg/m  Vital Signs and Nursing Note Reviewed Chaperone present Physical Exam  Constitutional: She is oriented to person, place, and time and well-developed, well-nourished, and in no distress.  Cardiovascular: Normal rate.  Pulmonary/Chest: Effort normal.    Musculoskeletal:        General: Normal range of motion.  Neurological: She is alert and oriented to person, place, and time. Gait normal.  Skin: Skin is warm and dry. She is not diaphoretic.  Psychiatric: Mood and affect  normal.      Assessment/Plan:     ICD-10-CM   1. Acquired absence of breast and absent nipple, bilateral  Z90.13   2. Malignant neoplasm of right female breast, unspecified estrogen receptor status, unspecified site of breast ([place])  C50.911     [name]  Her incisions are well-healed.  There is no sign of infection.  She is happy with the size of her implants.  In regards to the hard area along the medial aspect of the right breast, feels as if there is some scar tissue under the incision, recommend massaging with Mederma or unscented lotion. If it does not improve over time, we may be able to schedule for excision of scar tissue, but at this time it is not causing her any pain or discomfort.  In regards to [place] tattooing, will have [facility] call patient for follow-up, plan for NAC tattoo April.  Follow up for post-op appt in 1 year. Call with questions or concerns prior to follow up appt.   Carola Rhine Nena Hampe, PA-C 02/07/2019, 1:07 PM

## 2019-02-25 DIAGNOSIS — R413 Other amnesia: Secondary | ICD-10-CM | POA: Diagnosis not present

## 2019-02-25 DIAGNOSIS — R17 Unspecified jaundice: Secondary | ICD-10-CM | POA: Diagnosis not present

## 2019-02-25 DIAGNOSIS — Z853 Personal history of malignant neoplasm of breast: Secondary | ICD-10-CM | POA: Diagnosis not present

## 2019-02-25 DIAGNOSIS — G250 Essential tremor: Secondary | ICD-10-CM | POA: Diagnosis not present

## 2019-04-15 DIAGNOSIS — Z6820 Body mass index (BMI) 20.0-20.9, adult: Secondary | ICD-10-CM | POA: Diagnosis not present

## 2019-04-15 DIAGNOSIS — B001 Herpesviral vesicular dermatitis: Secondary | ICD-10-CM | POA: Diagnosis not present

## 2019-04-15 DIAGNOSIS — G250 Essential tremor: Secondary | ICD-10-CM | POA: Diagnosis not present

## 2019-04-15 DIAGNOSIS — Z1331 Encounter for screening for depression: Secondary | ICD-10-CM | POA: Diagnosis not present

## 2019-04-17 ENCOUNTER — Telehealth: Payer: Self-pay

## 2019-04-17 NOTE — Telephone Encounter (Signed)
Patient has questions about her NAC procedure on 05/27/2019. May want to postpone until fall. Please call her to discuss.

## 2019-04-22 ENCOUNTER — Telehealth: Payer: Self-pay

## 2019-04-22 ENCOUNTER — Other Ambulatory Visit: Payer: Self-pay

## 2019-04-22 MED ORDER — LIDOCAINE-PRILOCAINE 2.5-2.5 % EX CREA: 1.0000 "application " | TOPICAL_CREAM | CUTANEOUS | 0 refills | Status: DC | PRN

## 2019-04-22 NOTE — Telephone Encounter (Signed)
Call to pt re: her questions about bilateral nipple/areola tattoo She questioned if she would need to return for f/u- touch-up in 6 weeks following her initial tattoo session on 05/27/19 She will be traveling and would not be available for the 6 week f/u I informed her that she could postpone the f/u until after her vacation- without any complication She understands that she would need to use vaseline/gauze on the areas until the tattoo has healed and no longer open/peel or scabbed. She agrees and will keep the 05/27/19 appointment She did request RX for EMLA cream I will forward this RX to [facility] in [place], [place] per her request She is instructed to use the EMLA cream aprox 45 mins prior to procedure She will call for any concerns or changes as needed

## 2019-05-20 ENCOUNTER — Other Ambulatory Visit: Payer: Self-pay | Admitting: Endocrinology

## 2019-05-27 ENCOUNTER — Ambulatory Visit (INDEPENDENT_AMBULATORY_CARE_PROVIDER_SITE_OTHER): Payer: BC Managed Care – PPO

## 2019-05-27 ENCOUNTER — Other Ambulatory Visit: Payer: Self-pay

## 2019-05-27 VITALS — BP 107/73 | HR 77 | Temp 98.1°F

## 2019-05-27 DIAGNOSIS — Z9013 Acquired absence of bilateral breasts and nipples: Secondary | ICD-10-CM | POA: Diagnosis not present

## 2019-05-27 DIAGNOSIS — Z9889 Other specified postprocedural states: Secondary | ICD-10-CM

## 2019-05-27 NOTE — Patient Instructions (Signed)
Vaseline/gauze Moisturize Call for any questions F/u in 6 weeks

## 2019-05-27 NOTE — Progress Notes (Signed)
NIPPLE AREOLAR TATTOO PROCEDURE  PREOPERATIVE DIAGNOSIS:  Acquired absence of (BILATERAL} nipple areolar   POSTOPERATIVE DIAGNOSIS: Acquired absence of BILATERAL nipple areolar    PROCEDURES: BILATERAL nipple areolar tattoo   ATTENDING SURGEON: Dr. Lyndee Leo Sanger   ANESTHESIA:  EMLA  COMPLICATIONS: None.  JUSTIFICATION FOR PROCEDURE:  Ms. Shamoun is a 44 y.o. female with a history of breast cancer status post Bilateral breast reconstruction. The patient presents for nipple areolar complex tattoo. Risks, benefits, indications, and alternatives of the above described procedures were discussed with the patient and all the patient's questions were answered.   DESCRIPTION OF PROCEDURE: After informed consent was obtained and proper identification of patient and surgical site was made, the patient was taken to the procedure room. Pre-procedure photos were obtained and entered into chart Shape/size of NAC was decided by pt. myself & Dr. Emi Holes were chosen & approved by pt Pt was placed in the  supine position on the operating room table.  A time out was performed to confirm patient's identity and surgical site.  The patient was prepped and draped in the usual sterile fashion.  Using a #7 & a #9  tattoo head, pigment was instilled to the designed nipple areolar complex  Using the [employer]/Digital Pop machine  Once adequate pigment had been applied to the nipple areolar complex,  Post-procedure photos were taken & entered into chart vaseline & gauze dressing was applied. The patient tolerated the procedure well.  Ink used: World Jennette Bill- lot# [id number] 10/02/21 Bright Peach- lot# [id number] 12/12/21 Antique Gold- lot# [id number] 03/26/22 Warm Honey-lot#[id number] 12/12/21 Josph Macho Skin- lot# [id number]/15/24 Duration liquid anesthesia used as needed Sterile eye wash solution used to dilute/mix colors as  needed Post-procedure aftercare instructions given to pt & reviewed  Pt will call for any concerns F/u in 6 weeks for touch-up

## 2019-05-31 ENCOUNTER — Other Ambulatory Visit: Payer: Self-pay

## 2019-06-05 ENCOUNTER — Encounter: Payer: Self-pay | Admitting: Endocrinology

## 2019-06-05 ENCOUNTER — Other Ambulatory Visit: Payer: Self-pay

## 2019-06-05 ENCOUNTER — Ambulatory Visit: Payer: BC Managed Care – PPO | Admitting: Endocrinology

## 2019-06-05 VITALS — BP 100/60 | HR 79 | Ht 69.0 in | Wt 144.0 lb

## 2019-06-05 DIAGNOSIS — E061 Subacute thyroiditis: Secondary | ICD-10-CM

## 2019-06-05 LAB — TSH: TSH: 0.99 u[IU]/mL (ref 0.35–4.50)

## 2019-06-05 LAB — T4, FREE: Free T4: 1.15 ng/dL (ref 0.60–1.60)

## 2019-06-05 NOTE — Progress Notes (Signed)
Subjective:    Patient ID: Kaitlin Patterson, female    DOB: March 06, 1975, 44 y.o.   MRN: [id number]  HPI Pt returns for f/u of thyroiditis (dx'ed 2018; she chose tapazole rx; US showed small MNG; ENT advised against thyroidectomy; in early 2019, she spontaneously developed hypothyroidism, and started synthroid).  pt now takes inderal PRN, for tremor.    Past Medical History:  Diagnosis Date  . Allergic rhinitis   . Cancer ([place])    stage "0" breast cancer  . Hypothyroidism   . PONV (postoperative nausea and vomiting)     Past Surgical History:  Procedure Laterality Date  . ABDOMINAL HYSTERECTOMY  2018  . BREAST BIOPSY     x4  . BREAST RECONSTRUCTION WITH PLACEMENT OF TISSUE EXPANDER AND FLEX HD (ACELLULAR HYDRATED DERMIS) Bilateral 08/13/2018   Procedure: BREAST RECONSTRUCTION WITH PLACEMENT OF TISSUE EXPANDER AND FLEX HD (ACELLULAR HYDRATED DERMIS);  Surgeon: Wallace Going, DO;  Location: [facility];  Service: Plastics;  Laterality: Bilateral;  Dr. Lilia Pro from [facility] will be the first surgeon on the case. Please allow 2.5 hours for his portion of the surgery.  . CESAREAN SECTION N/A 2016   cesarean done when patient had twins  . HYSTEROTOMY Bilateral 11/29/2016   Partial all taken but overies.   Marland Kitchen LIPOMA EXCISION Right 10/29/2018   Procedure: EXCISION RIGHT AXILLARY LIPOMA;  Surgeon: Wallace Going, DO;  Location: [facility];  Service: Plastics;  Laterality: Right;  . REMOVAL OF BILATERAL TISSUE EXPANDERS WITH PLACEMENT OF BILATERAL BREAST IMPLANTS Bilateral 10/29/2018   Procedure: Removal of bilateral breast expanders and placement of silicone implants;  Surgeon: Wallace Going, DO;  Location: [facility];  Service: Plastics;  Laterality: Bilateral;  2.5 hours  . SIMPLE MASTECTOMY Bilateral 08/13/2018   WITH TISSUE EXPANDERS & RECONSTUCTION  . SIMPLE MASTECTOMY WITH AXILLARY SENTINEL NODE BIOPSY Bilateral 08/13/2018   Procedure: BILATERAL  SIMPLE MASTECTOMY AND EXCISION OF RIGHT AXILLARY MASS.;  Surgeon: Pollyann Samples, MD;  Location: [facility];  Service: General;  Laterality: Bilateral;  . TONSILLECTOMY  1996  . TUBAL LIGATION  2016    Social History   Socioeconomic History  . Marital status: Married    Spouse name: Not on file  . Number of children: Not on file  . Years of education: Not on file  . Highest education level: Not on file  Occupational History  . Not on file  Tobacco Use  . Smoking status: Never Smoker  . Smokeless tobacco: Never Used  Substance and Sexual Activity  . Alcohol use: No  . Drug use: No  . Sexual activity: Not Currently    Partners: Male  Other Topics Concern  . Not on file  Social History Narrative  . Not on file   Social Determinants of Health   Financial Resource Strain:   . Difficulty of Paying Living Expenses:   Food Insecurity:   . Worried About Charity fundraiser in the Last Year:   . Arboriculturist in the Last Year:   Transportation Needs:   . Film/video editor (Medical):   Marland Kitchen Lack of Transportation (Non-Medical):   Physical Activity:   . Days of Exercise per Week:   . Minutes of Exercise per Session:   Stress:   . Feeling of Stress :   Social Connections:   . Frequency of Communication with Friends and Family:   . Frequency of Social Gatherings with Friends and Family:   .  Attends Religious Services:   . Active Member of Clubs or Organizations:   . Attends Archivist Meetings:   Marland Kitchen Marital Status:   Intimate Partner Violence:   . Fear of Current or Ex-Partner:   . Emotionally Abused:   Marland Kitchen Physically Abused:   . Sexually Abused:     Current Outpatient Medications on File Prior to Visit  Medication Sig Dispense Refill  . Ascorbic Acid (VITAMIN C) 500 MG CHEW Chew 500 mg by mouth daily.    . Cetirizine HCl (ZYRTEC ALLERGY) 10 MG CAPS Take 1 capsule by mouth at bedtime.    . diazepam (VALIUM) 2 MG tablet Take 1 tablet (2 mg total) by mouth every 12  (twelve) hours as needed for anxiety. 10 tablet 0  . HYDROQUINONE EX Apply 1 application topically at bedtime as needed (dark spots).    . lidocaine-prilocaine (EMLA) cream Apply 1 application topically as needed. 30 g 0   No current facility-administered medications on file prior to visit.    Allergies  Allergen Reactions  . Codeine Nausea Only  . Adhesive [Tape] Itching and Other (See Comments)    redness  . Penicillins Rash    Did it involve swelling of the face/tongue/throat, SOB, or low BP? Unknown Did it involve sudden or severe rash/hives, skin peeling, or any reaction on the inside of your mouth or nose? Unknown Did you need to seek medical attention at a hospital or doctor's office? Unknown When did it last happen? Childhood reaction If all above answers are "NO", may proceed with cephalosporin use.     Family History  Problem Relation Age of Onset  . Hypothyroidism Mother     BP 100/60   Pulse 79   Ht 5\' 9"  (1.753 m)   Wt 144 lb (65.3 kg)   LMP 11/05/2016 (Approximate)   SpO2 99%   BMI 21.27 kg/m    Review of Systems     Objective:   Physical Exam VITAL SIGNS:  See vs page GENERAL: no distress NECK: There is no palpable thyroid enlargement.  No thyroid nodule is palpable.  No palpable lymphadenopathy at the anterior neck.   Lab Results  Component Value Date   TSH 0.99 06/05/2019      Assessment & Plan:  Hypothyroidism: well-replaced Please continue the same medication Please come back for a follow-up appointment in 6 months

## 2019-06-05 NOTE — Patient Instructions (Signed)
Blood tests are requested for you today.  We'll let you know about the results.  Please come back for a follow-up appointment in 6 months.   

## 2019-06-07 MED ORDER — LEVOTHYROXINE SODIUM 75 MCG PO TABS: 75.0000 ug | ORAL_TABLET | Freq: Every day | ORAL | 3 refills | Status: DC

## 2019-08-23 DIAGNOSIS — R350 Frequency of micturition: Secondary | ICD-10-CM | POA: Diagnosis not present

## 2019-08-23 DIAGNOSIS — R300 Dysuria: Secondary | ICD-10-CM | POA: Diagnosis not present

## 2019-08-23 DIAGNOSIS — Z6821 Body mass index (BMI) 21.0-21.9, adult: Secondary | ICD-10-CM | POA: Diagnosis not present

## 2019-08-23 DIAGNOSIS — R3915 Urgency of urination: Secondary | ICD-10-CM | POA: Diagnosis not present

## 2019-09-16 ENCOUNTER — Ambulatory Visit: Payer: BC Managed Care – PPO

## 2019-09-17 DIAGNOSIS — U071 COVID-19: Secondary | ICD-10-CM | POA: Diagnosis not present

## 2019-09-20 ENCOUNTER — Other Ambulatory Visit (HOSPITAL_COMMUNITY): Payer: Self-pay | Admitting: Nurse Practitioner

## 2019-09-20 DIAGNOSIS — Z853 Personal history of malignant neoplasm of breast: Secondary | ICD-10-CM

## 2019-09-20 DIAGNOSIS — U071 COVID-19: Secondary | ICD-10-CM

## 2019-09-20 NOTE — Progress Notes (Signed)
I connected by phone with Kaitlin Patterson on 09/20/2019 at 4:45 PM to discuss the potential use of a new treatment for mild to moderate COVID-19 viral infection in non-hospitalized patients.  This patient is a 44 y.o. female that meets the FDA criteria for Emergency Use Authorization of COVID monoclonal antibody casirivimab/imdevimab.  Has a (+) direct SARS-CoV-2 viral test result  Has mild or moderate COVID-19   Is NOT hospitalized due to COVID-19  Is within 10 days of symptom onset  Has at least one of the high risk factor(s) for progression to severe COVID-19 and/or hospitalization as defined in EUA.  Specific high risk criteria : Immunosuppressive Disease or Treatment and Other high risk medical condition per CDC:  thyroid disease, hx cancer   I have spoken and communicated the following to the patient or parent/caregiver regarding COVID monoclonal antibody treatment:  1. FDA has authorized the emergency use for the treatment of mild to moderate COVID-19 in adults and pediatric patients with positive results of direct SARS-CoV-2 viral testing who are 57 years of age and older weighing at least 40 kg, and who are at high risk for progressing to severe COVID-19 and/or hospitalization.  2. The significant known and potential risks and benefits of COVID monoclonal antibody, and the extent to which such potential risks and benefits are unknown.  3. Information on available alternative treatments and the risks and benefits of those alternatives, including clinical trials.  4. Patients treated with COVID monoclonal antibody should continue to self-isolate and use infection control measures (e.g., wear mask, isolate, social distance, avoid sharing personal items, clean and disinfect "high touch" surfaces, and frequent handwashing) according to CDC guidelines.   5. The patient or parent/caregiver has the option to accept or refuse COVID monoclonal antibody treatment.  After reviewing this information  with the patient, The patient agreed to proceed with receiving casirivimab\imdevimab infusion and will be provided a copy of the Fact sheet prior to receiving the infusion. Kaitlin Patterson 09/20/2019 4:45 PM

## 2019-09-21 ENCOUNTER — Other Ambulatory Visit (HOSPITAL_COMMUNITY): Payer: Self-pay

## 2019-09-21 ENCOUNTER — Ambulatory Visit (HOSPITAL_COMMUNITY)
Admission: RE | Admit: 2019-09-21 | Discharge: 2019-09-21 | Disposition: A | Discharge status: Home / Self Care | Payer: BC Managed Care – PPO | Source: Ambulatory Visit | Attending: Pulmonary Disease | Admitting: Pulmonary Disease

## 2019-09-21 DIAGNOSIS — U071 COVID-19: Secondary | ICD-10-CM | POA: Diagnosis not present

## 2019-09-21 DIAGNOSIS — Z853 Personal history of malignant neoplasm of breast: Secondary | ICD-10-CM

## 2019-09-21 MED ORDER — DIPHENHYDRAMINE HCL 50 MG/ML IJ SOLN: 50.0000 mg | Freq: Once | INTRAMUSCULAR | Status: DC | PRN

## 2019-09-21 MED ORDER — METHYLPREDNISOLONE SODIUM SUCC 125 MG IJ SOLR: 125.0000 mg | Freq: Once | INTRAMUSCULAR | Status: DC | PRN

## 2019-09-21 MED ORDER — ALBUTEROL SULFATE HFA 108 (90 BASE) MCG/ACT IN AERS: 2.0000 | INHALATION_SPRAY | Freq: Once | RESPIRATORY_TRACT | Status: DC | PRN

## 2019-09-21 MED ORDER — SODIUM CHLORIDE 0.9 % IV SOLN
1200.0000 mg | Freq: Once | INTRAVENOUS | Status: AC
  Administered 2019-09-21: 1200 mg via INTRAVENOUS

## 2019-09-21 MED ORDER — ACETAMINOPHEN 325 MG PO TABS
650.0000 mg | ORAL_TABLET | Freq: Once | ORAL | Status: AC
  Administered 2019-09-21: 650 mg via ORAL
  Filled 2019-09-21: qty 2

## 2019-09-21 MED ORDER — FAMOTIDINE IN NACL 20-0.9 MG/50ML-% IV SOLN: 20.0000 mg | Freq: Once | INTRAVENOUS | Status: DC | PRN

## 2019-09-21 MED ORDER — EPINEPHRINE 0.3 MG/0.3ML IJ SOAJ: 0.3000 mg | Freq: Once | INTRAMUSCULAR | Status: DC | PRN

## 2019-09-21 MED ORDER — SODIUM CHLORIDE 0.9 % IV SOLN: INTRAVENOUS | Status: DC | PRN

## 2019-09-21 NOTE — Discharge Instructions (Signed)

## 2019-09-21 NOTE — Progress Notes (Signed)
  Diagnosis: COVID-19  Physician:Dr Joya Gaskins   Procedure: [facility] Med: casirivimab\imdevimab infusion - Provided patient with casirivimab\imdevimab fact sheet for patients, parents and caregivers prior to infusion.  Complications: No immediate complications noted.  Discharge: Discharged home   Dewaine Oats 09/21/2019

## 2019-09-30 ENCOUNTER — Ambulatory Visit (INDEPENDENT_AMBULATORY_CARE_PROVIDER_SITE_OTHER): Payer: BC Managed Care – PPO

## 2019-09-30 ENCOUNTER — Other Ambulatory Visit: Payer: Self-pay

## 2019-09-30 VITALS — BP 100/70 | HR 80 | Temp 98.1°F | Ht 69.0 in | Wt 143.0 lb

## 2019-09-30 DIAGNOSIS — Z9889 Other specified postprocedural states: Secondary | ICD-10-CM | POA: Diagnosis not present

## 2019-09-30 DIAGNOSIS — Z9013 Acquired absence of bilateral breasts and nipples: Secondary | ICD-10-CM

## 2019-10-01 NOTE — Patient Instructions (Signed)
Pt will use vaseline/gauze dressing for approx 2 weeks She will call for any concerns/changes F/U in 6 weeks for tattoo touch-up if needed.

## 2019-10-01 NOTE — Progress Notes (Signed)
NIPPLE AREOLAR TATTOO PROCEDURE  PREOPERATIVE DIAGNOSIS:  Acquired absence of (BILATERAL) nipple areolar   POSTOPERATIVE DIAGNOSIS: Acquired absence of (BILATERAL) nipple areolar    PROCEDURES: (BILATERAL) nipple areolar tattoo touch-up  ATTENDING SURGEON: Dr. Lyndee Leo Dillingham  ANESTHESIA:  EMLA- pt applied 30 mins prior to procedure  COMPLICATIONS: None.  JUSTIFICATION FOR PROCEDURE:  Kaitlin Patterson is a 44 y.o. female with a history of breast cancer status post bilateral breast reconstruction. The patient presents for nipple areolar complex tattoo touch-up.  Risks, benefits, indications, and alternatives of the above described procedures were discussed with the patient and all the patient's questions were answered.   DESCRIPTION OF PROCEDURE: After informed consent was obtained and proper identification of patient and surgical site was made, the patient was taken to the procedure room and pre-procedure photos were taken & entered into chart. The pt was then placed supine on the operating room table.  A time out was performed to confirm patient's identity and surgical site. The patient was prepped and draped in the usual sterile fashion.  using a # 7 tattoo head, pigment was instilled to the designed nipple areolar complex bilaterally.  Once adequate pigment had been applied to the nipple areolar complex, post procedure photos were obtained & entered into pt's chart.  vaseline & gauze dressing was applied. The patient tolerated the procedure well.   We reviewed the post procedure care instructions. World Famous Ink used: Fair Honey/Cool Honey/[place]/Cool Mink/[place] Skin Duration used as needed. Ink lot #'s & exp. dates are available on review

## 2019-10-10 DIAGNOSIS — Z86000 Personal history of in-situ neoplasm of breast: Secondary | ICD-10-CM | POA: Diagnosis not present

## 2019-11-17 IMAGING — MG MM CLIP PLACEMENT
4 series · 4 of 12 positions shown · non-contrast
Comparison: Previous exam(s).

CLINICAL DATA: Recent diagnosis of right breast cancer. Status post
MR guided core biopsy of masses in the right breast.

EXAM:
DIAGNOSTIC RIGHT MAMMOGRAM POST MRI BIOPSY

[L CC synth-2D]
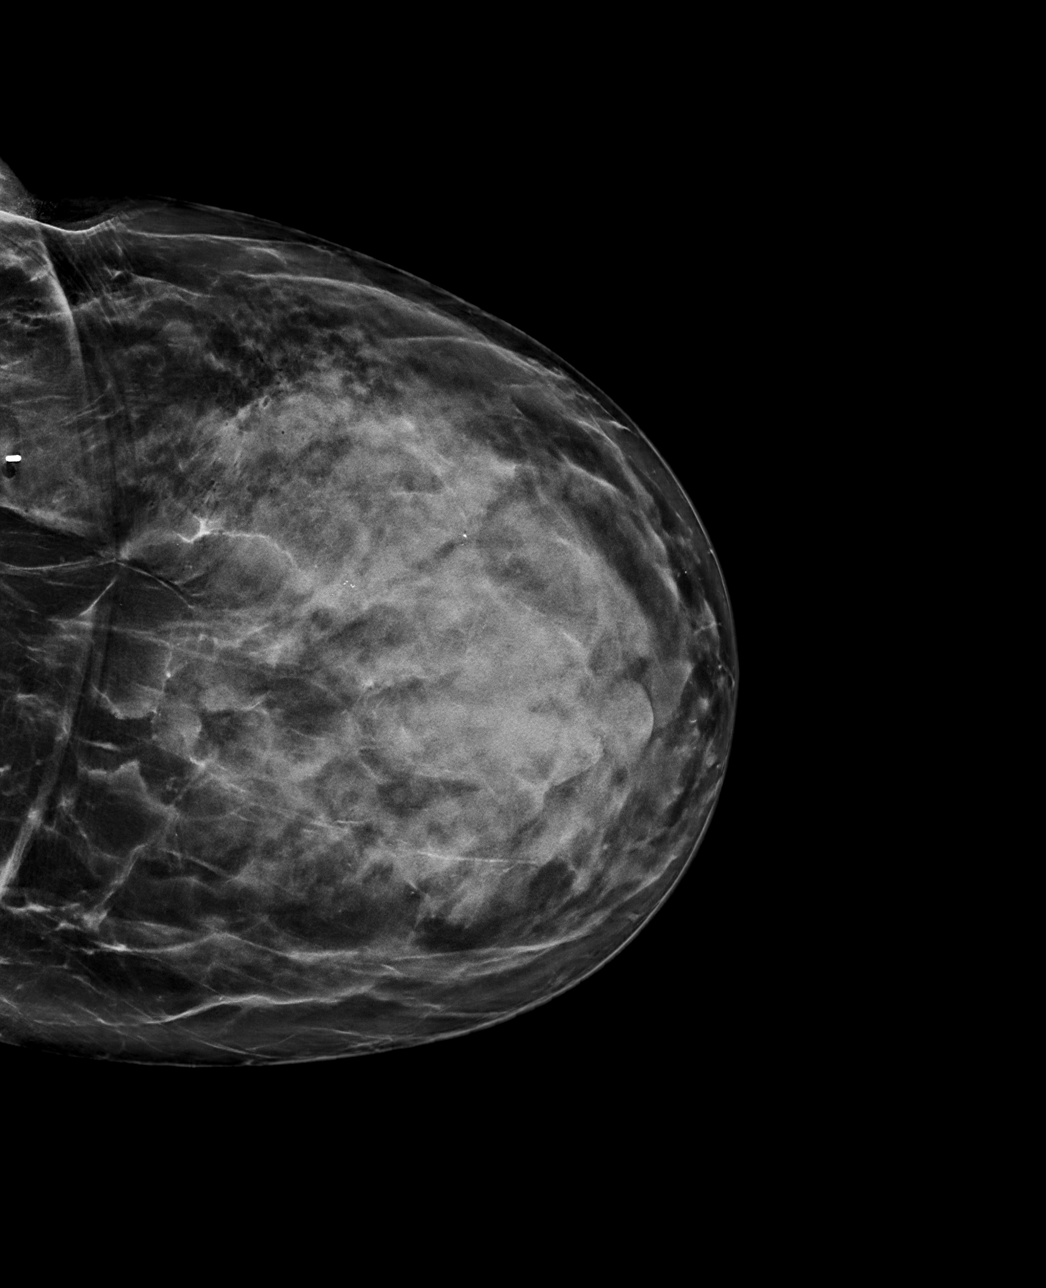

[L ML synth-2D]
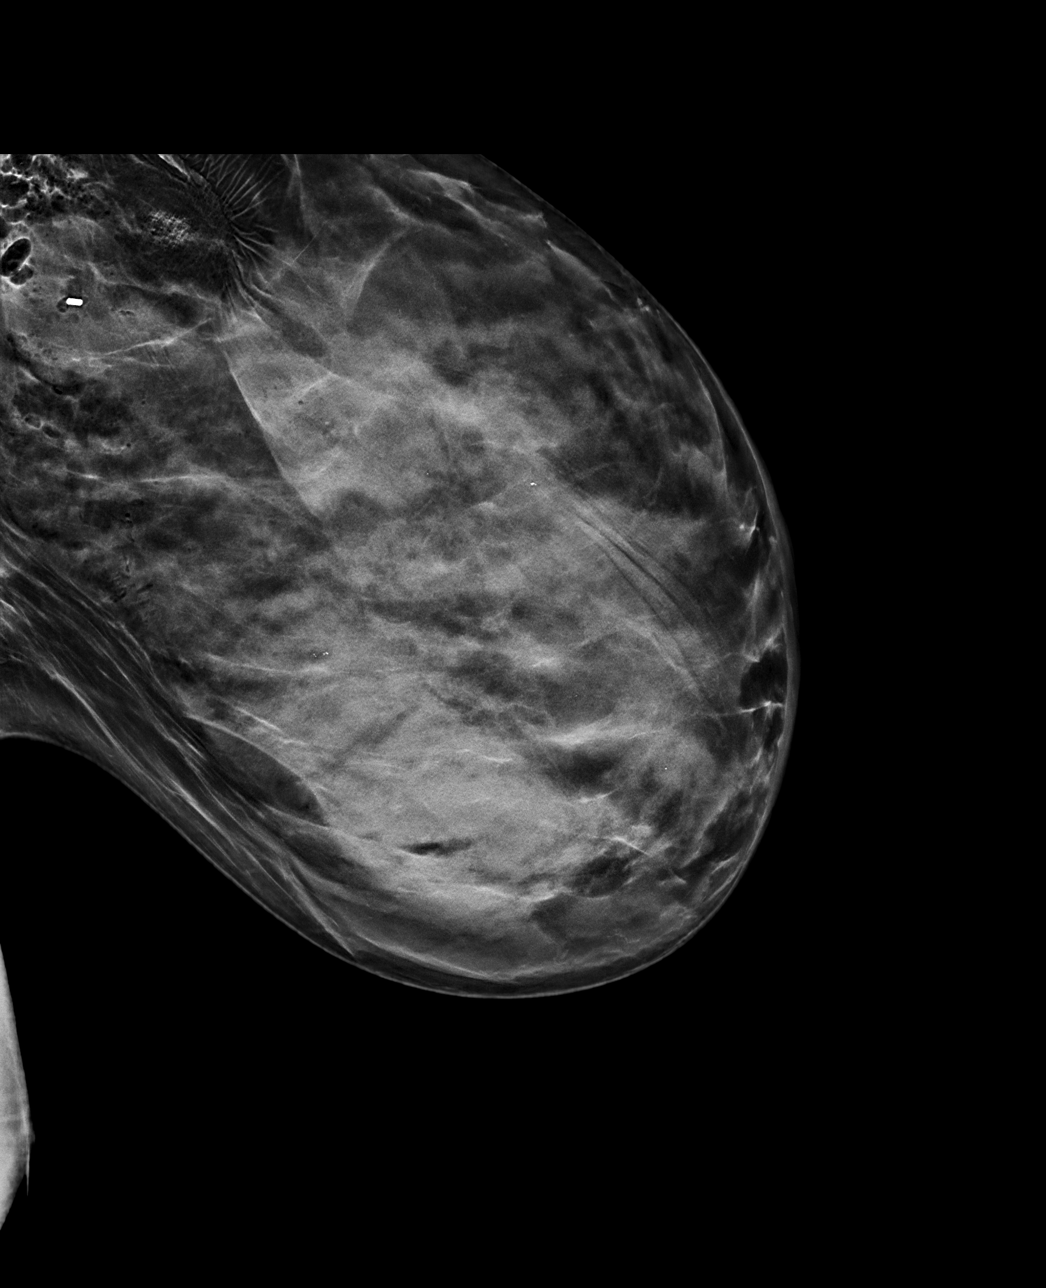

[L CC tomo · tomo slice 39/78.0]
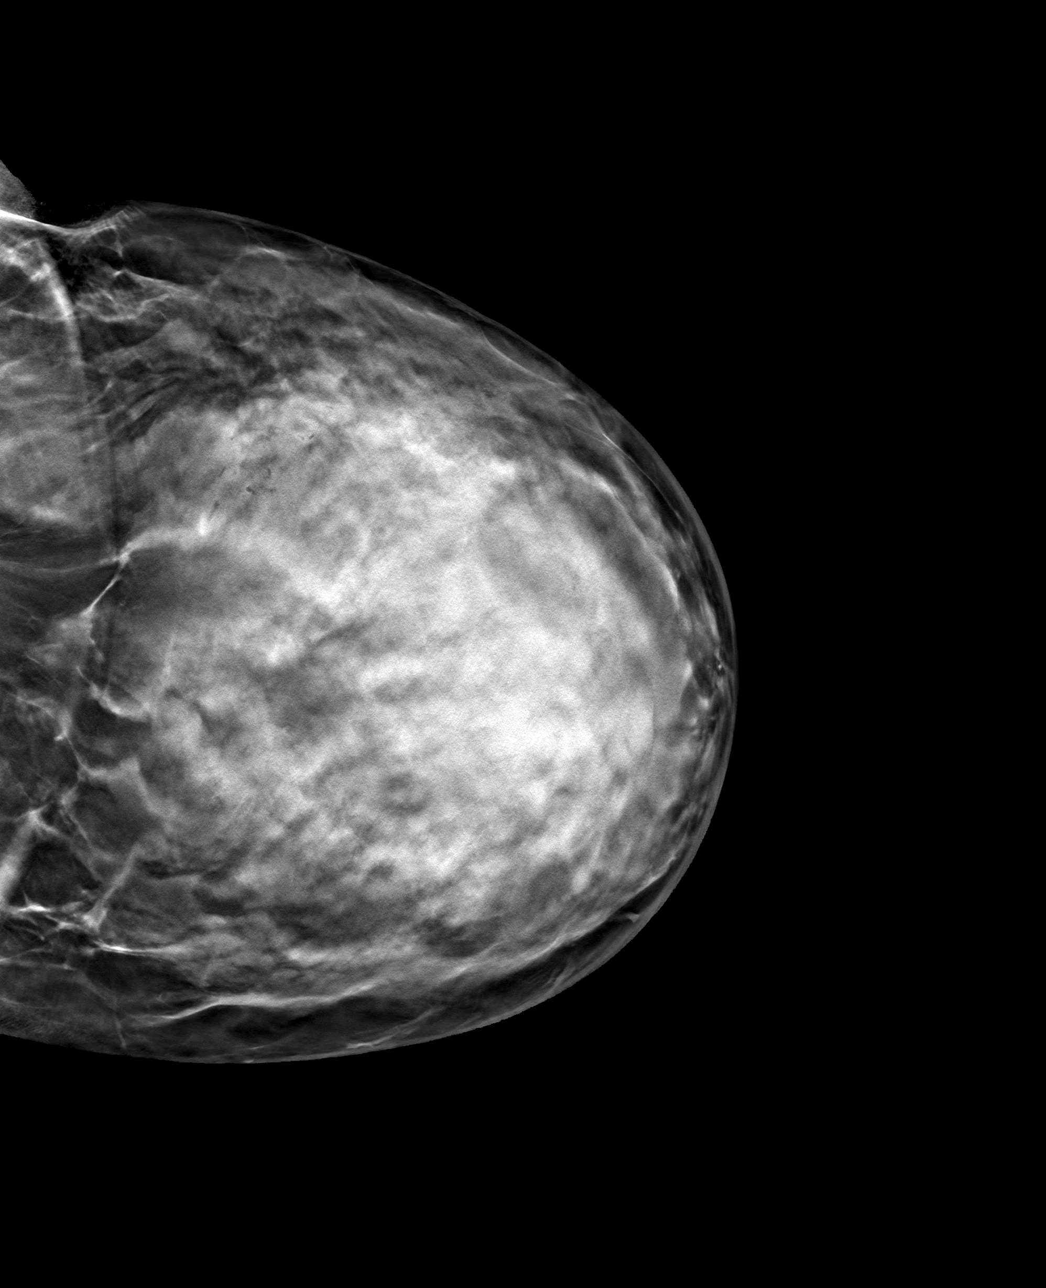

[L ML tomo · tomo slice 37/73.0]
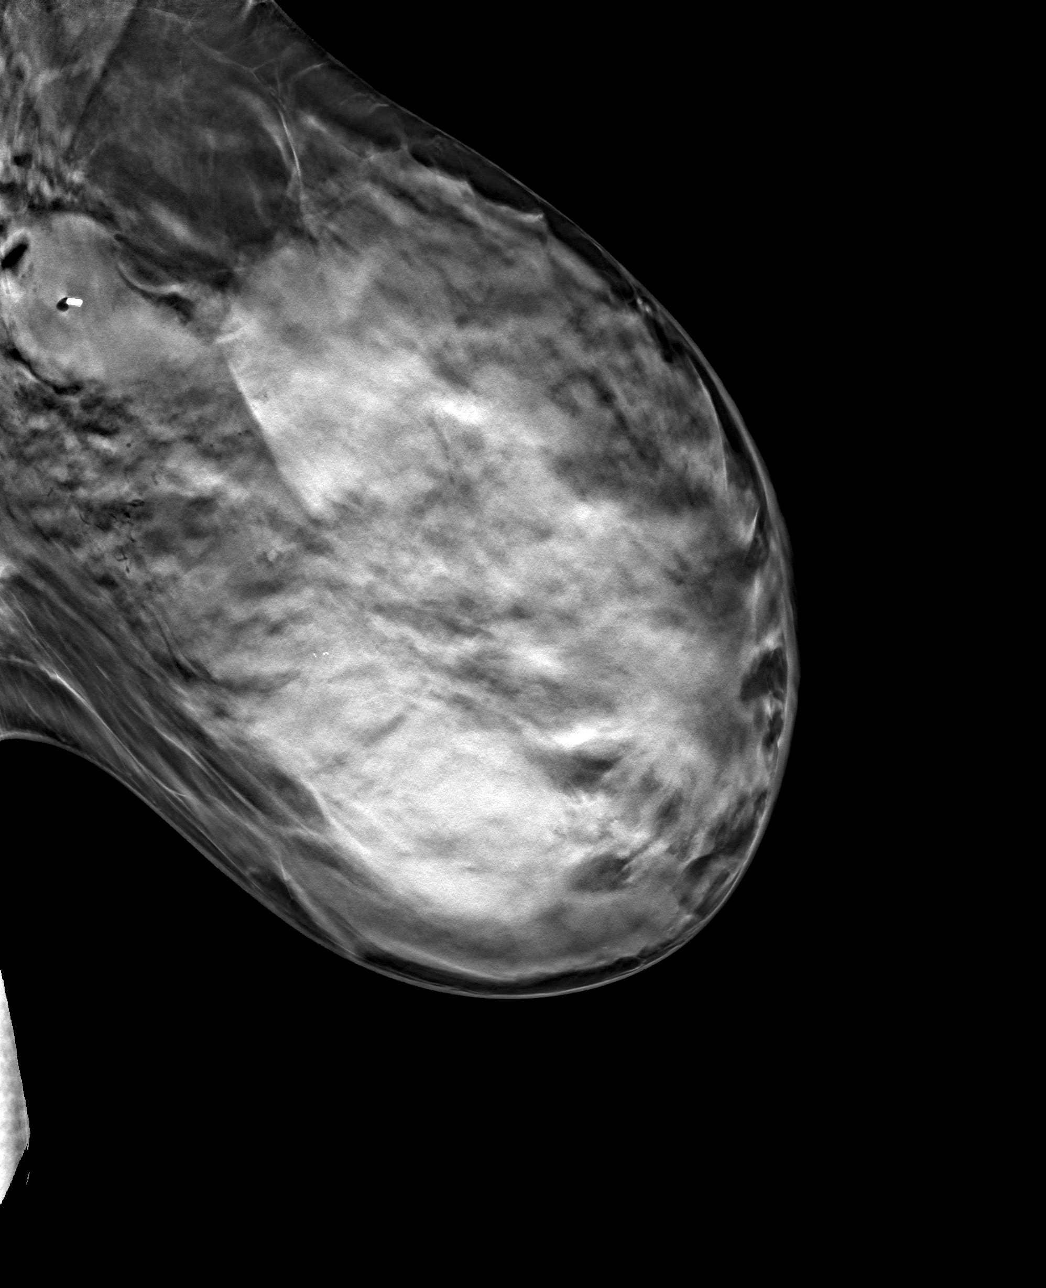

[4 of 12 positions shown; findings below may reference images not displayed]

FINDINGS: Mammographic images were obtained following MRI guided biopsy of
mass in the middle depth lateral right breast (barbell clip) and
mass in the posterolateral right breast (cylinder clip). Cc and
lateral views of the right breast demonstrate biopsy clips in the
areas of concern.
IMPRESSION: Post biopsy mammogram demonstrating biopsy clips in the expected
areas of concern.

Final Assessment: Post Procedure Mammograms for Marker Placement

## 2019-11-17 IMAGING — MR MR BREAST BX W LOC DEV 1ST LESION IMAGE BX SPEC MR GUIDE*R*
9 of 14 series · 32 of 48 positions shown · IV contrast (magnevist)
Comparison: Previous exams.
COMPARISON: Previous exams.

Addendum:
CLINICAL DATA: Recent diagnosis of right breast cancer. Masses seen
on MRI in the right breast for biopsy.

EXAM:
MRI GUIDED CORE NEEDLE BIOPSY OF THE RIGHT BREAST
TECHNIQUE: Multiplanar, multisequence MR imaging of the right breast was
performed both before and after administration of intravenous
contrast.
CONTRAST:  6 mL Magnevist

[Series 2: fiducial bilateral · sagittal · 2.0mm · 1.33mm/px · 4 of 144 slices shown]
[im 1/144]
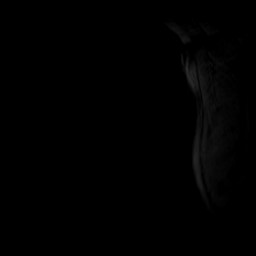
[im 48/144]
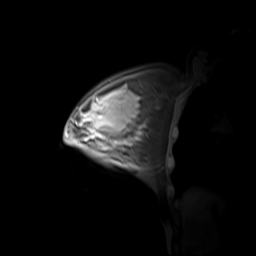
[im 96/144]
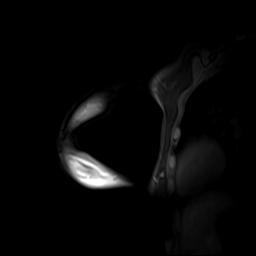
[im 144/144]
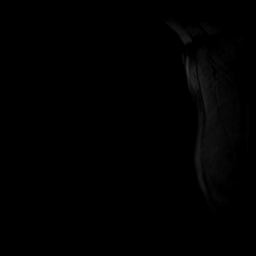

[Series 3: dynamic pre · axial · non-contrast · 1.3mm · 0.73mm/px · z∈[-98,+129]mm · 4 of 176 slices shown]
[im 1/176]
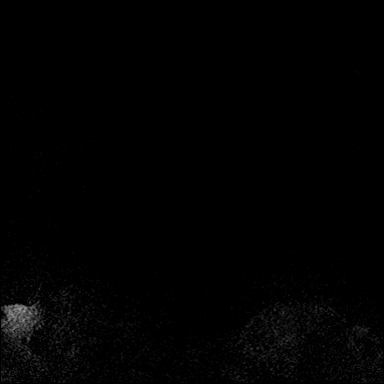
[im 59/176]
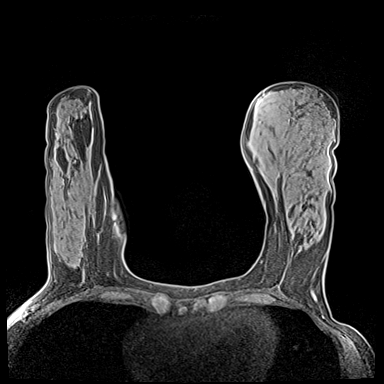
[im 117/176]
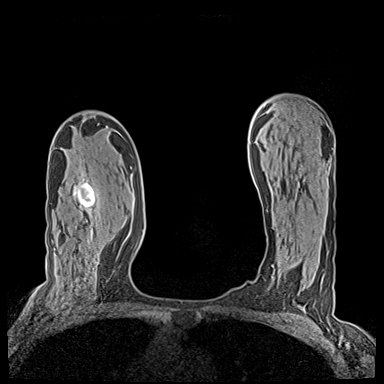
[im 176/176]
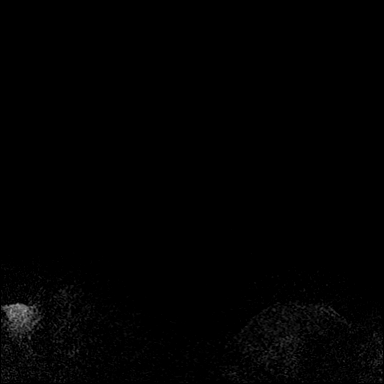

[Series 4: dynamic post 20 · axial · 1.3mm · 0.73mm/px · z∈[-98,+129]mm · 4 of 176 slices shown (1 of 2)]
[im 1/176]
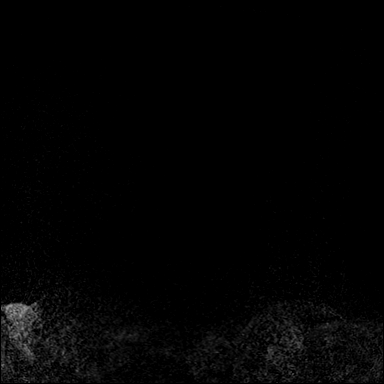
[im 59/176]
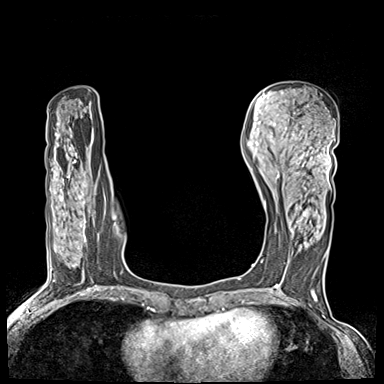
[im 117/176]
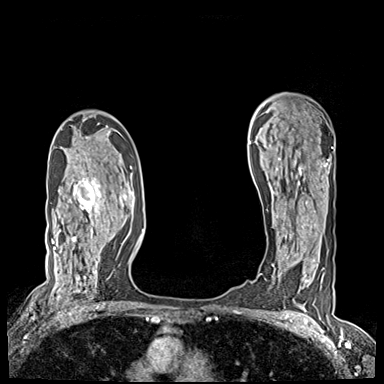
[im 176/176]
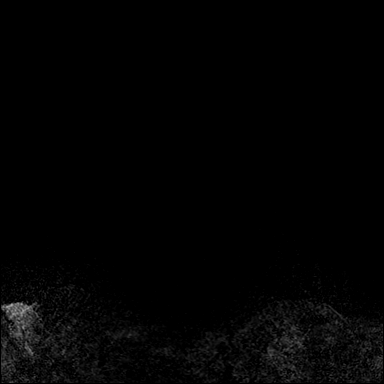

[Series 5: dynamic post 20 · axial · 1.3mm · 0.73mm/px · z∈[-98,+129]mm · 4 of 176 slices shown (2 of 2)]
[im 1/176]
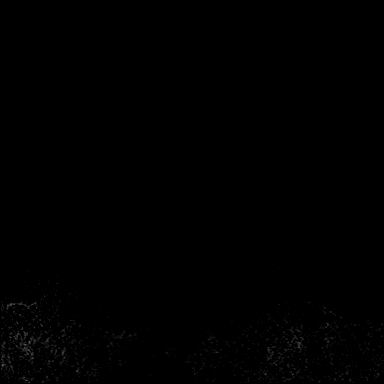
[im 59/176]
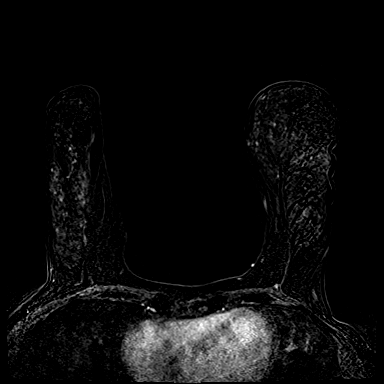
[im 117/176]
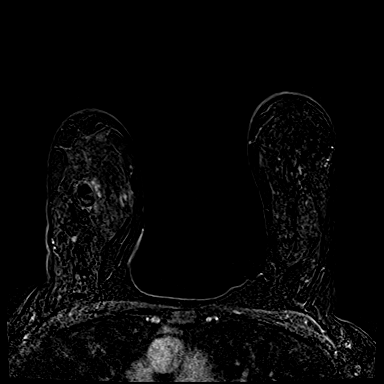
[im 176/176]
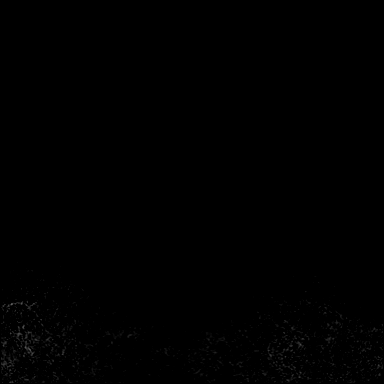

[Series 6: dynamic post 3 · axial · 1.3mm · 0.73mm/px · z∈[-98,+129]mm · 4 of 176 slices shown (1 of 2)]
[im 1/176]
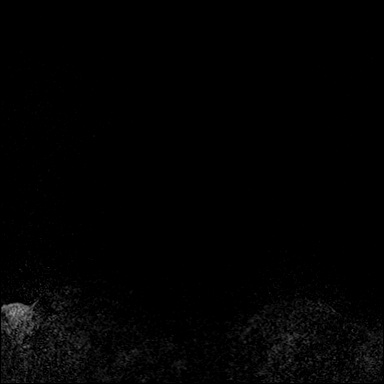
[im 59/176]
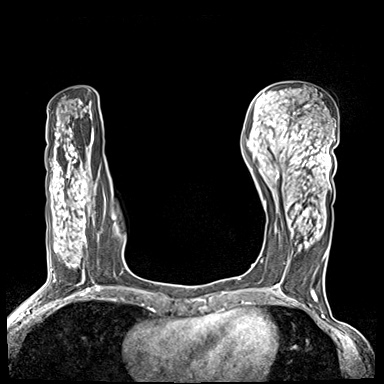
[im 117/176]
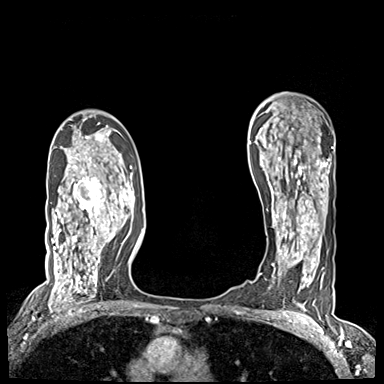
[im 176/176]
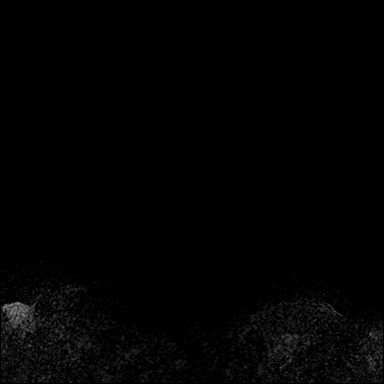

[Series 7: dynamic post 3 · axial · 1.3mm · 0.73mm/px · z∈[-98,+129]mm · 4 of 176 slices shown (2 of 2)]
[im 1/176]
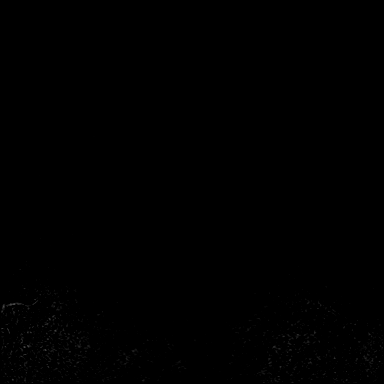
[im 59/176]
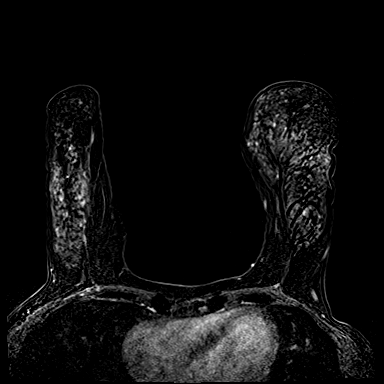
[im 117/176]
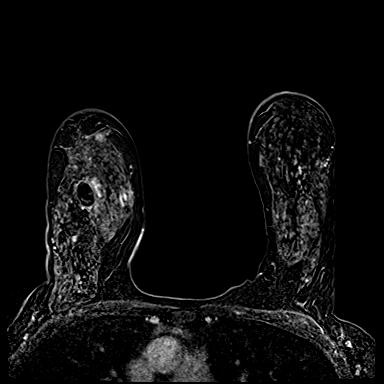
[im 176/176]
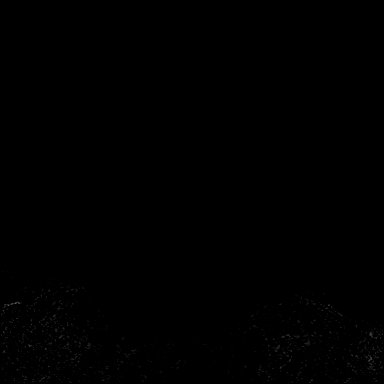

[Series 8: needle confirmation · axial · 1.3mm · 0.73mm/px · z∈[-98,+129]mm · 3 of 176 slices shown (1 of 2)]
[im 1/176]
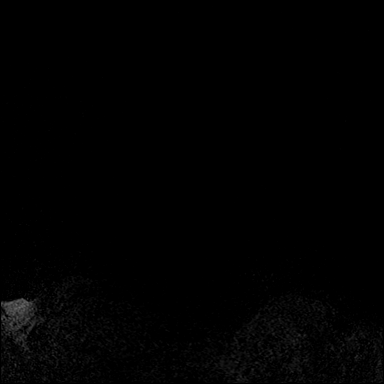
[im 88/176]
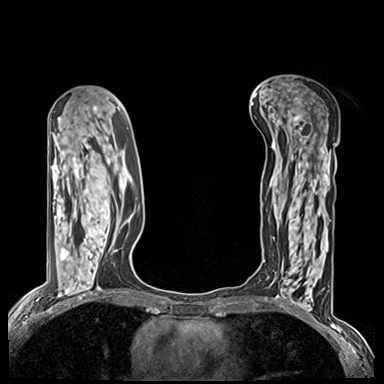
[im 176/176]
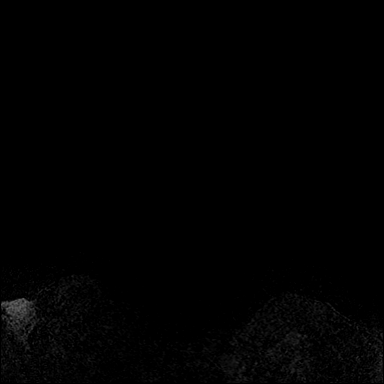

[Series 9: needle confirmation_sub · axial · 1.3mm · 0.73mm/px · z∈[-98,+129]mm · 3 of 176 slices shown]
[im 1/176]
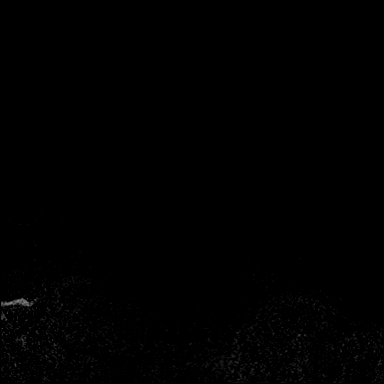
[im 88/176]
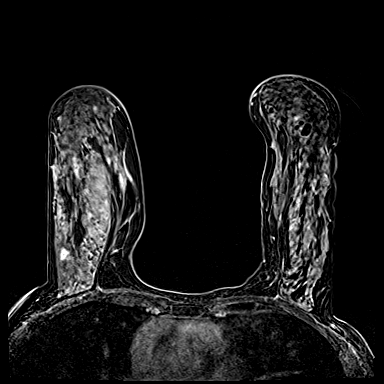
[im 176/176]
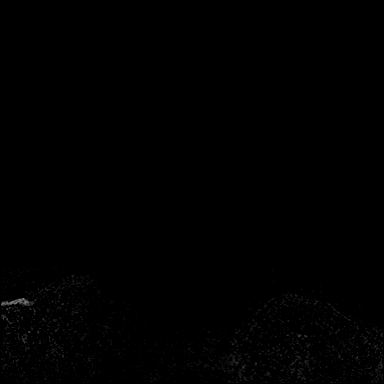

[Series 10: needle confirmation · axial · 1.3mm · 0.73mm/px · z∈[-98,+15]mm · 2 of 176 slices shown (2 of 2)]
[im 1/176]
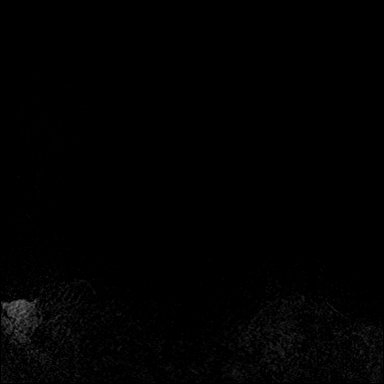
[im 88/176]
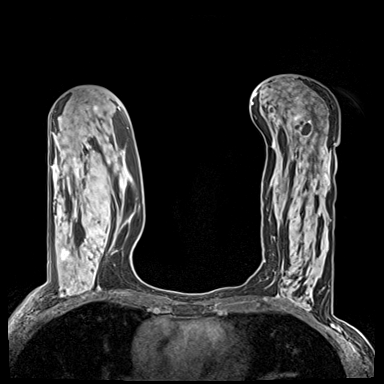

[32 of 48 positions shown; findings below may reference images not displayed]

FINDINGS: I met with the patient, and we discussed the procedure of MRI guided
biopsy, including risks, benefits, and alternatives. Specifically,
we discussed the risks of infection, bleeding, tissue injury, clip
migration, and inadequate sampling. Informed, written consent was
given. The usual time out protocol was performed immediately prior
to the procedure.

Using sterile technique, 1% Lidocaine, MRI guidance, and a 9 gauge
vacuum assisted device, biopsy was performed of middle depth lateral
right breast mass using a lateral approach. At the conclusion of the
procedure, a barbell tissue marker clip was deployed into the biopsy
cavity. Follow-up 2-view mammogram was performed and dictated
separately.

Using sterile technique, 1% Lidocaine, MRI guidance, and a 9 gauge
vacuum assisted device, biopsy was performed of posterior lateral
right breast mass using a lateral approach. At the conclusion of the
procedure, a cylinder tissue marker clip was deployed into the
biopsy cavity. Follow-up 2-view mammogram was performed and dictated
separately.
IMPRESSION: MRI guided biopsy of right breast.  No apparent complications.

ADDENDUM:
Pathology revealed FIBROCYSTIC CHANGES, USUAL DUCT EPITHELIAL
HYPERPLASIA of the LEFT breast, central posterior. This was found to
be concordant by Dr. Rtoyota Joshjax.

Pathology revealed FIBROCYSTIC CHANGES, USUAL DUCT EPITHELIAL
HYPERPLASIA of the RIGHT breast, posterior lateral. This was found
to be concordant by Dr. Rtoyota Joshjax.

Pathology revealed - FIBROCYSTIC CHANGES, USUAL DUCT EPITHELIAL
HYPERPLASIA, PSEUDOANGIOMATOUS STROMAL HYPERPLASIA of the RIGHT
breast, middle depth lateral. This was found to be concordant by Dr.
Rtoyota Joshjax.

Pathology results were discussed with the patient by telephone. The
patient reported doing well after the biopsies with tenderness at
the sites. Post biopsy instructions and care were reviewed and
questions were answered. The patient was encouraged to call The

The patient has a recent diagnosis of RIGHT breast cancer and should
follow her outlined treatment plan.

The patient is scheduled to see Dr. Suhyen Delimount at [REDACTED], [HOSPITAL] location, on June 15, 2018.

Imaging and pathology reports were faxed to Dr. Jhon Elver on June 11, 2018.

Pathology results reported by Real Joben Polo Brocq, RN on 06/11/2018.

*** End of Addendum ***
FINDINGS: I met with the patient, and we discussed the procedure of MRI guided
biopsy, including risks, benefits, and alternatives. Specifically,
we discussed the risks of infection, bleeding, tissue injury, clip
migration, and inadequate sampling. Informed, written consent was
given. The usual time out protocol was performed immediately prior
to the procedure.

Using sterile technique, 1% Lidocaine, MRI guidance, and a 9 gauge
vacuum assisted device, biopsy was performed of middle depth lateral
right breast mass using a lateral approach. At the conclusion of the
procedure, a barbell tissue marker clip was deployed into the biopsy
cavity. Follow-up 2-view mammogram was performed and dictated
separately.

Using sterile technique, 1% Lidocaine, MRI guidance, and a 9 gauge
vacuum assisted device, biopsy was performed of posterior lateral
right breast mass using a lateral approach. At the conclusion of the
procedure, a cylinder tissue marker clip was deployed into the
biopsy cavity. Follow-up 2-view mammogram was performed and dictated
separately.
IMPRESSION: MRI guided biopsy of right breast.  No apparent complications.

## 2019-12-02 ENCOUNTER — Other Ambulatory Visit: Payer: Self-pay

## 2019-12-02 ENCOUNTER — Ambulatory Visit (INDEPENDENT_AMBULATORY_CARE_PROVIDER_SITE_OTHER): Payer: BC Managed Care – PPO

## 2019-12-02 VITALS — BP 108/53 | HR 75 | Temp 97.8°F | Ht 69.0 in | Wt 145.0 lb

## 2019-12-02 DIAGNOSIS — Z9889 Other specified postprocedural states: Secondary | ICD-10-CM | POA: Diagnosis not present

## 2019-12-02 DIAGNOSIS — Z9013 Acquired absence of bilateral breasts and nipples: Secondary | ICD-10-CM

## 2019-12-02 DIAGNOSIS — Z01419 Encounter for gynecological examination (general) (routine) without abnormal findings: Secondary | ICD-10-CM | POA: Diagnosis not present

## 2019-12-05 ENCOUNTER — Ambulatory Visit: Payer: BC Managed Care – PPO | Admitting: Endocrinology

## 2019-12-14 DIAGNOSIS — R059 Cough, unspecified: Secondary | ICD-10-CM | POA: Diagnosis not present

## 2019-12-14 DIAGNOSIS — Z20822 Contact with and (suspected) exposure to covid-19: Secondary | ICD-10-CM | POA: Diagnosis not present

## 2019-12-14 DIAGNOSIS — J069 Acute upper respiratory infection, unspecified: Secondary | ICD-10-CM | POA: Diagnosis not present

## 2019-12-19 ENCOUNTER — Ambulatory Visit: Payer: BC Managed Care – PPO | Admitting: Endocrinology

## 2019-12-25 NOTE — Patient Instructions (Signed)
Pt will use vaseline/xeroform & 4x4 gauze dressing for approx 2 weeks She will call for any concerns F/U 6 weeks for tattoo touch-up

## 2019-12-25 NOTE — Progress Notes (Signed)
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 Lyndee Leo Dillingham  ANESTHESIA:  EMLA  COMPLICATIONS: None.  JUSTIFICATION FOR PROCEDURE:  Kaitlin Patterson is a 43 y.o. female with a history of breast cancer status post bilateral breast reconstruction. The patient presents for nipple areolar complex tattoo touch-up.  Risks, benefits, indications, and alternatives of the above described procedures were discussed with the patient and all the patient's questions were answered.   DESCRIPTION OF PROCEDURE: After informed consent was obtained and proper identification of patient and surgical site was made, the patient was taken to the procedure room and pre-procedure photos taken & entered into chart. Pt was then placed supine on the operating room table. A time out was performed to confirm patient's identity and surgical site. The patient was prepped and draped in the usual sterile fashion.  Using a # 7 tattoo head, pigment was instilled to the designed nipple areolar complex bilaterally.  Once adequate pigment had been applied to the nipple areolar complex, a post procedure xeroform dressing was applied. The patient tolerated the procedure well.  We reviewed post-procedure care World famous ink: Bright Peach/ Fair Peach/ [employer] Duration used as needed Lot #'s and expiration dates on file

## 2020-02-05 ENCOUNTER — Other Ambulatory Visit: Payer: Self-pay

## 2020-02-06 ENCOUNTER — Ambulatory Visit: Payer: BC Managed Care – PPO | Admitting: Endocrinology

## 2020-02-07 ENCOUNTER — Ambulatory Visit: Payer: BC Managed Care – PPO | Admitting: Endocrinology

## 2020-02-07 ENCOUNTER — Other Ambulatory Visit: Payer: Self-pay

## 2020-02-07 ENCOUNTER — Encounter: Payer: Self-pay | Admitting: Plastic Surgery

## 2020-02-07 ENCOUNTER — Ambulatory Visit: Payer: BC Managed Care – PPO | Admitting: Plastic Surgery

## 2020-02-07 VITALS — BP 113/74 | HR 75

## 2020-02-07 VITALS — BP 100/74 | HR 76 | Ht 69.0 in | Wt 150.2 lb

## 2020-02-07 DIAGNOSIS — E061 Subacute thyroiditis: Secondary | ICD-10-CM

## 2020-02-07 DIAGNOSIS — C50911 Malignant neoplasm of unspecified site of right female breast: Secondary | ICD-10-CM | POA: Diagnosis not present

## 2020-02-07 DIAGNOSIS — Z9013 Acquired absence of bilateral breasts and nipples: Secondary | ICD-10-CM

## 2020-02-07 LAB — TSH: TSH: 0.93 u[IU]/mL (ref 0.35–4.50)

## 2020-02-07 LAB — T4, FREE: Free T4: 1.21 ng/dL (ref 0.60–1.60)

## 2020-02-07 NOTE — Progress Notes (Signed)
Subjective:    Patient ID: Kaitlin Patterson, female    DOB: 10/01/1975, 45 y.o.   MRN: [id number]  HPI Pt returns for f/u of thyroiditis (dx'ed 2018; she chose tapazole rx; US showed small MNG; ENT advised against thyroidectomy; in early 2019, she spontaneously developed hypothyroidism, and started synthroid; she takes inderal PRN, for tremor).  pt states she feels well in general, except for weight gain.    Past Medical History:  Diagnosis Date  . Allergic rhinitis   . Cancer ([place])    stage "0" breast cancer  . Hypothyroidism   . PONV (postoperative nausea and vomiting)     Past Surgical History:  Procedure Laterality Date  . ABDOMINAL HYSTERECTOMY  2018  . BREAST BIOPSY     x4  . BREAST RECONSTRUCTION WITH PLACEMENT OF TISSUE EXPANDER AND FLEX HD (ACELLULAR HYDRATED DERMIS) Bilateral 08/13/2018   Procedure: BREAST RECONSTRUCTION WITH PLACEMENT OF TISSUE EXPANDER AND FLEX HD (ACELLULAR HYDRATED DERMIS);  Surgeon: Wallace Going, DO;  Location: [facility];  Service: Plastics;  Laterality: Bilateral;  Dr. Lilia Pro from [facility] will be the first surgeon on the case. Please allow 2.5 hours for his portion of the surgery.  . CESAREAN SECTION N/A 2016   cesarean done when patient had twins  . HYSTEROTOMY Bilateral 11/29/2016   Partial all taken but overies.   Marland Kitchen LIPOMA EXCISION Right 10/29/2018   Procedure: EXCISION RIGHT AXILLARY LIPOMA;  Surgeon: Wallace Going, DO;  Location: [facility];  Service: Plastics;  Laterality: Right;  . REMOVAL OF BILATERAL TISSUE EXPANDERS WITH PLACEMENT OF BILATERAL BREAST IMPLANTS Bilateral 10/29/2018   Procedure: Removal of bilateral breast expanders and placement of silicone implants;  Surgeon: Wallace Going, DO;  Location: [facility];  Service: Plastics;  Laterality: Bilateral;  2.5 hours  . SIMPLE MASTECTOMY Bilateral 08/13/2018   WITH TISSUE EXPANDERS & RECONSTUCTION  . SIMPLE MASTECTOMY WITH AXILLARY SENTINEL  NODE BIOPSY Bilateral 08/13/2018   Procedure: BILATERAL SIMPLE MASTECTOMY AND EXCISION OF RIGHT AXILLARY MASS.;  Surgeon: Pollyann Samples, MD;  Location: [facility];  Service: General;  Laterality: Bilateral;  . TONSILLECTOMY  1996  . TUBAL LIGATION  2016    Social History   Socioeconomic History  . Marital status: Married    Spouse name: Not on file  . Number of children: Not on file  . Years of education: Not on file  . Highest education level: Not on file  Occupational History  . Not on file  Tobacco Use  . Smoking status: Never Smoker  . Smokeless tobacco: Never Used  Vaping Use  . Vaping Use: Never used  Substance and Sexual Activity  . Alcohol use: No  . Drug use: No  . Sexual activity: Not Currently    Partners: Male  Other Topics Concern  . Not on file  Social History Narrative  . Not on file   Social Determinants of Health   Financial Resource Strain: Not on file  Food Insecurity: Not on file  Transportation Needs: Not on file  Physical Activity: Not on file  Stress: Not on file  Social Connections: Not on file  Intimate Partner Violence: Not on file    Current Outpatient Medications on File Prior to Visit  Medication Sig Dispense Refill  . Ascorbic Acid (VITAMIN C) 500 MG CHEW Chew 500 mg by mouth daily.    . Cetirizine HCl (ZYRTEC ALLERGY) 10 MG CAPS Take 1 capsule by mouth at bedtime.    Marland Kitchen  diazepam (VALIUM) 2 MG tablet Take 1 tablet (2 mg total) by mouth every 12 (twelve) hours as needed for anxiety. 10 tablet 0  . HYDROQUINONE EX Apply 1 application topically at bedtime as needed (dark spots).    Marland Kitchen levothyroxine (SYNTHROID) 75 MCG tablet Take 1 tablet (75 mcg total) by mouth daily before breakfast. 90 tablet 3  . lidocaine-prilocaine (EMLA) cream Apply 1 application topically as needed. 30 g 0   No current facility-administered medications on file prior to visit.    Allergies  Allergen Reactions  . Codeine Nausea Only  . Adhesive [Tape] Itching and  Other (See Comments)    redness  . Penicillins Rash    Did it involve swelling of the face/tongue/throat, SOB, or low BP? Unknown Did it involve sudden or severe rash/hives, skin peeling, or any reaction on the inside of your mouth or nose? Unknown Did you need to seek medical attention at a hospital or doctor's office? Unknown When did it last happen? Childhood reaction If all above answers are "NO", may proceed with cephalosporin use.     Family History  Problem Relation Age of Onset  . Hypothyroidism Mother     BP 100/74 (BP Location: Right Arm, Patient Position: Sitting, Cuff Size: Normal)   Pulse 76   Ht 5\' 9"  (1.753 m)   Wt 150 lb 3.2 oz (68.1 kg)   LMP 11/05/2016 (Approximate)   SpO2 98%   BMI 22.18 kg/m    Review of Systems     Objective:   Physical Exam VITAL SIGNS:  See vs page GENERAL: no distress NECK: There is no palpable thyroid enlargement.  No thyroid nodule is palpable.  No palpable lymphadenopathy at the anterior neck.    Lab Results  Component Value Date   TSH 0.93 02/07/2020       Assessment & Plan:  Hypothyroidism: well-replaced. Please continue the same synthroid

## 2020-02-07 NOTE — Progress Notes (Signed)
Patient ID: Kaitlin Patterson, female    DOB: August 23, 1975, 44 y.o.   MRN: [id number]   Chief Complaint  Patient presents with  . Follow-up  . Breast Problem    The patient is a 45 year old female here for 1 year follow-up on her bilateral breast reconstruction.  In August 2020 she underwent bilateral mastectomies with immediate reconstruction with expanders.  She then had her exchange to implants in October.  She currently has an Product manager smooth round ultra high profile gel implants 430 cc bilaterally.   She originally came for a reduction when mammogram showed abnormalities.  She had an MRI with further concerns and then biopsy which showed cancer.  Her preoperative bra size was a 32G.  She is 5 feet 9 inches tall.  She has had her tattoos for her nipple areola.  She is very pleased with her results.  She looks so good.  She is very happy with her results.  She has a little bit of flattening on the lateral view.  She could have Lipo filling to this area at any time if she wants to rounded up a little bit.  She is having some trouble with her thyroid or at least with weight gain.  She is having some test done to see if there is any need to adjust her thyroid medicine.  She has gained about 10 pounds.   Review of Systems  Constitutional: Negative.   HENT: Negative.   Eyes: Negative.   Respiratory: Negative.   Cardiovascular: Negative.   Gastrointestinal: Negative.   Endocrine: Negative.   Genitourinary: Negative.   Musculoskeletal: Negative.   Hematological: Negative.     Past Medical History:  Diagnosis Date  . Allergic rhinitis   . Cancer ([place])    stage "0" breast cancer  . Hypothyroidism   . PONV (postoperative nausea and vomiting)     Past Surgical History:  Procedure Laterality Date  . ABDOMINAL HYSTERECTOMY  2018  . BREAST BIOPSY     x4  . BREAST RECONSTRUCTION WITH PLACEMENT OF TISSUE EXPANDER AND FLEX HD (ACELLULAR HYDRATED DERMIS) Bilateral 08/13/2018   Procedure: BREAST  RECONSTRUCTION WITH PLACEMENT OF TISSUE EXPANDER AND FLEX HD (ACELLULAR HYDRATED DERMIS);  Surgeon: Wallace Going, DO;  Location: [facility];  Service: Plastics;  Laterality: Bilateral;  Dr. Lilia Pro from [facility] will be the first surgeon on the case. Please allow 2.5 hours for his portion of the surgery.  . CESAREAN SECTION N/A 2016   cesarean done when patient had twins  . HYSTEROTOMY Bilateral 11/29/2016   Partial all taken but overies.   Marland Kitchen LIPOMA EXCISION Right 10/29/2018   Procedure: EXCISION RIGHT AXILLARY LIPOMA;  Surgeon: Wallace Going, DO;  Location: [facility];  Service: Plastics;  Laterality: Right;  . REMOVAL OF BILATERAL TISSUE EXPANDERS WITH PLACEMENT OF BILATERAL BREAST IMPLANTS Bilateral 10/29/2018   Procedure: Removal of bilateral breast expanders and placement of silicone implants;  Surgeon: Wallace Going, DO;  Location: [place];  Service: Plastics;  Laterality: Bilateral;  2.5 hours  . SIMPLE MASTECTOMY Bilateral 08/13/2018   WITH TISSUE EXPANDERS & RECONSTUCTION  . SIMPLE MASTECTOMY WITH AXILLARY SENTINEL NODE BIOPSY Bilateral 08/13/2018   Procedure: BILATERAL SIMPLE MASTECTOMY AND EXCISION OF RIGHT AXILLARY MASS.;  Surgeon: Pollyann Samples, MD;  Location: [facility];  Service: General;  Laterality: Bilateral;  . TONSILLECTOMY  1996  . TUBAL LIGATION  2016      Current Outpatient Medications:  .  Ascorbic Acid (VITAMIN C) 500 MG CHEW, Chew 500 mg by mouth daily., Disp: , Rfl:  .  Cetirizine HCl (ZYRTEC ALLERGY) 10 MG CAPS, Take 1 capsule by mouth at bedtime., Disp: , Rfl:  .  diazepam (VALIUM) 2 MG tablet, Take 1 tablet (2 mg total) by mouth every 12 (twelve) hours as needed for anxiety., Disp: 10 tablet, Rfl: 0 .  HYDROQUINONE EX, Apply 1 application topically at bedtime as needed (dark spots)., Disp: , Rfl:  .  levothyroxine (SYNTHROID) 75 MCG tablet, Take 1 tablet (75 mcg total) by mouth daily before breakfast., Disp: 90  tablet, Rfl: 3 .  lidocaine-prilocaine (EMLA) cream, Apply 1 application topically as needed., Disp: 30 g, Rfl: 0   Objective:   Vitals:   02/07/20 1101  BP: 113/74  Pulse: 75  SpO2: 100%    Physical Exam Vitals and nursing note reviewed.  Constitutional:      Appearance: Normal appearance.  Cardiovascular:     Rate and Rhythm: Normal rate.     Pulses: Normal pulses.  Pulmonary:     Effort: Pulmonary effort is normal. No respiratory distress.  Abdominal:     General: Abdomen is flat. There is no distension.     Palpations: There is no mass.     Tenderness: There is no abdominal tenderness.  Skin:    General: Skin is warm.     Capillary Refill: Capillary refill takes less than 2 seconds.     Coloration: Skin is not jaundiced.     Findings: No bruising or lesion.  Neurological:     General: No focal deficit present.     Mental Status: She is alert. Mental status is at baseline.  Psychiatric:        Mood and Affect: Mood normal.        Behavior: Behavior normal.     Assessment & Plan:  Malignant neoplasm of right female breast, unspecified estrogen receptor status, unspecified site of breast ([place])  Status post bilateral mastectomy  Follow-up in 1 year.  We talked about the option for an ultrasound at the 3-year mark.  Liposuction is a option at any time.  Recommend that she get her weight to stable place and then months talk more about Lipo filling.  Patient is pleased with this decision.  Pictures were obtained of the patient and placed in the chart with the patient's or guardian's permission.   [place], DO

## 2020-02-07 NOTE — Patient Instructions (Signed)
Blood tests are requested for you today.  We'll let you know about the results.  Please come back for a follow-up appointment in 6 months.   

## 2020-03-09 ENCOUNTER — Other Ambulatory Visit: Payer: Self-pay

## 2020-03-09 ENCOUNTER — Ambulatory Visit: Payer: BC Managed Care – PPO | Admitting: Podiatry

## 2020-03-09 DIAGNOSIS — B351 Tinea unguium: Secondary | ICD-10-CM | POA: Diagnosis not present

## 2020-03-09 DIAGNOSIS — L603 Nail dystrophy: Secondary | ICD-10-CM | POA: Diagnosis not present

## 2020-03-09 MED ORDER — FLUCONAZOLE 150 MG PO TABS: 150.0000 mg | ORAL_TABLET | ORAL | 2 refills | Status: DC

## 2020-03-09 NOTE — Progress Notes (Signed)
  Subjective:  Patient ID: Kaitlin Patterson, female    DOB: Jun 27, 1975,  MRN: [id number]  Chief Complaint  Patient presents with  . Nail Problem    BL hallux nails discolored, thickening, hard and burning sensation  -w/ redness at nails bed and swelling -BL nails got loose and started clipping away Tx: OTC nail tx and polysporin     45 y.o. female presents with the above complaint. History confirmed with patient. Denies known trauma to the nails.  Objective:  Physical Exam: warm, good capillary refill, no trophic changes or ulcerative lesions, normal DP and PT pulses and normal sensory exam. Bilateral hallux nails thickened, brittle. Yellowish discoloration. Assessment:   1. Onychomycosis   2. Nail dystrophy      Plan:  Patient was evaluated and treated and all questions answered.  Nail Fungus -Educated on possible etiologies including possibility of traumatic cause -Rx fluconazole weekly. Discussed r/b of medication -Educated on self care.  Return in about 6 weeks (around 04/20/2020) for Nail Fungus.

## 2020-04-23 ENCOUNTER — Ambulatory Visit: Payer: BC Managed Care – PPO | Admitting: Podiatry

## 2020-04-30 ENCOUNTER — Other Ambulatory Visit: Payer: Self-pay

## 2020-04-30 ENCOUNTER — Ambulatory Visit: Payer: BC Managed Care – PPO | Admitting: Podiatry

## 2020-04-30 ENCOUNTER — Encounter: Payer: Self-pay | Admitting: Podiatry

## 2020-04-30 DIAGNOSIS — L603 Nail dystrophy: Secondary | ICD-10-CM

## 2020-04-30 DIAGNOSIS — B351 Tinea unguium: Secondary | ICD-10-CM

## 2020-04-30 NOTE — Progress Notes (Signed)
  Subjective:  Patient ID: Kaitlin Patterson, female    DOB: 03-Mar-1975,  MRN: [id number]  Chief Complaint  Patient presents with  . Nail Problem    I am not sure if the medicine did help but they are slowly growing out     45 y.o. female presents with the above complaint. History confirmed with patient.    Objective:  Physical Exam: warm, good capillary refill, no trophic changes or ulcerative lesions, normal DP and PT pulses and normal sensory exam. Bilateral hallux nails mildly thick, good coloration with proximal clearing Assessment:   1. Onychomycosis   2. Nail dystrophy      Plan:  Patient was evaluated and treated and all questions answered.  Nail Fungus -Improving. Continue current regimen until completion. Advised her to come back if she is not satisfied with the progress. Some thickness may be 2/2 trauma. Nails burred courtesy to decrease thickness.  No follow-ups on file.

## 2020-06-13 ENCOUNTER — Other Ambulatory Visit: Payer: Self-pay | Admitting: Endocrinology

## 2020-08-27 ENCOUNTER — Ambulatory Visit: Payer: BC Managed Care – PPO | Admitting: Endocrinology

## 2020-10-08 DIAGNOSIS — Z86000 Personal history of in-situ neoplasm of breast: Secondary | ICD-10-CM | POA: Diagnosis not present

## 2020-10-14 ENCOUNTER — Ambulatory Visit: Payer: BC Managed Care – PPO | Admitting: Endocrinology

## 2020-10-14 ENCOUNTER — Other Ambulatory Visit: Payer: Self-pay

## 2020-10-14 VITALS — BP 100/80 | HR 76 | Ht 69.0 in | Wt 150.0 lb

## 2020-10-14 DIAGNOSIS — E061 Subacute thyroiditis: Secondary | ICD-10-CM | POA: Diagnosis not present

## 2020-10-14 LAB — T4, FREE: Free T4: 1.27 ng/dL (ref 0.60–1.60)

## 2020-10-14 LAB — TSH: TSH: 0.74 u[IU]/mL (ref 0.35–5.50)

## 2020-10-14 NOTE — Progress Notes (Signed)
Subjective:    Patient ID: Kaitlin Patterson, female    DOB: [birth year], 45 y.o.   MRN: [id number]  HPI Pt returns for f/u of thyroiditis (dx'ed 2018; she chose tapazole rx; US showed small MNG; ENT advised against thyroidectomy; in early 2019, she spontaneously developed hypothyroidism, and started synthroid; she takes inderal PRN, for tremor).  She reports weight gain and difficulty with concentration.   Past Medical History:  Diagnosis Date   Allergic rhinitis    Cancer ([place])    stage "0" breast cancer   Hypothyroidism    PONV (postoperative nausea and vomiting)     Past Surgical History:  Procedure Laterality Date   ABDOMINAL HYSTERECTOMY  2018   BREAST BIOPSY     x4   BREAST RECONSTRUCTION WITH PLACEMENT OF TISSUE EXPANDER AND FLEX HD (ACELLULAR HYDRATED DERMIS) Bilateral 08/13/2018   Procedure: BREAST RECONSTRUCTION WITH PLACEMENT OF TISSUE EXPANDER AND FLEX HD (ACELLULAR HYDRATED DERMIS);  Surgeon: Wallace Going, DO;  Location: [facility];  Service: Plastics;  Laterality: Bilateral;  Dr. Lilia Pro from [facility] will be the first surgeon on the case. Please allow 2.5 hours for his portion of the surgery.   CESAREAN SECTION N/A 2016   cesarean done when patient had twins   HYSTEROTOMY Bilateral 11/29/2016   Partial all taken but overies.    LIPOMA EXCISION Right 10/29/2018   Procedure: EXCISION RIGHT AXILLARY LIPOMA;  Surgeon: Wallace Going, DO;  Location: [facility];  Service: Plastics;  Laterality: Right;   REMOVAL OF BILATERAL TISSUE EXPANDERS WITH PLACEMENT OF BILATERAL BREAST IMPLANTS Bilateral 10/29/2018   Procedure: Removal of bilateral breast expanders and placement of silicone implants;  Surgeon: Wallace Going, DO;  Location: [facility];  Service: Plastics;  Laterality: Bilateral;  2.5 hours   SIMPLE MASTECTOMY Bilateral 08/13/2018   WITH TISSUE EXPANDERS & RECONSTUCTION   SIMPLE MASTECTOMY WITH AXILLARY SENTINEL NODE BIOPSY  Bilateral 08/13/2018   Procedure: BILATERAL SIMPLE MASTECTOMY AND EXCISION OF RIGHT AXILLARY MASS.;  Surgeon: Pollyann Samples, MD;  Location: [facility];  Service: General;  Laterality: Bilateral;   TONSILLECTOMY  1996   TUBAL LIGATION  2016    Social History   Socioeconomic History   Marital status: Married    Spouse name: Not on file   Number of children: Not on file   Years of education: Not on file   Highest education level: Not on file  Occupational History   Not on file  Tobacco Use   Smoking status: Never   Smokeless tobacco: Never  Vaping Use   Vaping Use: Never used  Substance and Sexual Activity   Alcohol use: No   Drug use: No   Sexual activity: Not Currently    Partners: Male  Other Topics Concern   Not on file  Social History Narrative   Not on file   Social Determinants of Health   Financial Resource Strain: Not on file  Food Insecurity: Not on file  Transportation Needs: Not on file  Physical Activity: Not on file  Stress: Not on file  Social Connections: Not on file  Intimate Partner Violence: Not on file    Current Outpatient Medications on File Prior to Visit  Medication Sig Dispense Refill   Cetirizine HCl (ZYRTEC ALLERGY) 10 MG CAPS Take 1 capsule by mouth at bedtime.     fluconazole (DIFLUCAN) 150 MG tablet Take 1 tablet (150 mg total) by mouth once a week. 4 tablet 2  HYDROQUINONE EX Apply 1 application topically at bedtime as needed (dark spots).     levothyroxine (SYNTHROID) 75 MCG tablet TAKE 1 TABLET (75 MCG TOTAL) BY MOUTH DAILY BEFORE BREAKFAST. 90 tablet 3   propranolol (INDERAL) 10 MG tablet Take 10 mg by mouth daily.     No current facility-administered medications on file prior to visit.     Family History  Problem Relation Age of Onset   Hypothyroidism Mother     BP 100/80 (BP Location: Right Arm, Patient Position: Sitting, Cuff Size: Normal)   Pulse 76   Ht 5\' 9"  (1.753 m)   Wt 150 lb (68 kg)   LMP 11/05/2016 (Approximate)    SpO2 99%   BMI 22.15 kg/m    Review of Systems     Objective:   Physical Exam NECK: There is no palpable thyroid enlargement.  No thyroid nodule is palpable.  No palpable lymphadenopathy at the anterior neck.   Lab Results  Component Value Date   TSH 0.74 10/14/2020       Assessment & Plan:  Hypothyroidism: well-controlled.  Please continue the same synthroid.

## 2020-10-14 NOTE — Patient Instructions (Signed)
Blood tests are requested for you today.  We'll let you know about the results.  Please come back for a follow-up appointment in 6 months.   

## 2020-12-08 DIAGNOSIS — Z01419 Encounter for gynecological examination (general) (routine) without abnormal findings: Secondary | ICD-10-CM | POA: Diagnosis not present

## 2020-12-08 DIAGNOSIS — Z809 Family history of malignant neoplasm, unspecified: Secondary | ICD-10-CM | POA: Diagnosis not present

## 2020-12-08 DIAGNOSIS — Z0000 Encounter for general adult medical examination without abnormal findings: Secondary | ICD-10-CM | POA: Diagnosis not present

## 2020-12-08 DIAGNOSIS — Z1329 Encounter for screening for other suspected endocrine disorder: Secondary | ICD-10-CM | POA: Diagnosis not present

## 2020-12-08 DIAGNOSIS — Z8249 Family history of ischemic heart disease and other diseases of the circulatory system: Secondary | ICD-10-CM | POA: Diagnosis not present

## 2020-12-08 DIAGNOSIS — Z131 Encounter for screening for diabetes mellitus: Secondary | ICD-10-CM | POA: Diagnosis not present

## 2021-01-11 DIAGNOSIS — D72819 Decreased white blood cell count, unspecified: Secondary | ICD-10-CM | POA: Diagnosis not present

## 2021-02-09 ENCOUNTER — Ambulatory Visit: Payer: BC Managed Care – PPO | Admitting: Plastic Surgery

## 2021-02-09 ENCOUNTER — Other Ambulatory Visit: Payer: Self-pay

## 2021-02-09 ENCOUNTER — Encounter: Payer: Self-pay | Admitting: Plastic Surgery

## 2021-02-09 DIAGNOSIS — Z853 Personal history of malignant neoplasm of breast: Secondary | ICD-10-CM | POA: Diagnosis not present

## 2021-02-09 DIAGNOSIS — C50911 Malignant neoplasm of unspecified site of right female breast: Secondary | ICD-10-CM

## 2021-02-09 DIAGNOSIS — Z9013 Acquired absence of bilateral breasts and nipples: Secondary | ICD-10-CM

## 2021-02-09 NOTE — Progress Notes (Signed)
Patient ID: Kaitlin Patterson, female    DOB: July 16, 1975, 46 y.o.   MRN: [id number]   Chief Complaint  Patient presents with   Follow-up   Breast Problem    HPI The patient is a 46 year old female here for follow-up on her breast reconstruction.  She had bilateral mastectomies with implant-based reconstruction.  The first procedure was August 2020 and then she had the exchange to implants in October 2020.  Her genetics were negative.  She is 5 feet 9 inches tall.  She has [employer] smooth round ultrahigh profile 430 cc gel implants in place.  She had nipple areola reconstruction with tattoos.  Overall she is very happy.  She has a little bit of volume loss to the lower poles.  She says in close she feels fine.  There are no areas of concern and no capsular contracture.  She has not had any imaging since June 2020.  Review of Systems  Constitutional: Negative.   HENT: Negative.    Eyes: Negative.   Respiratory: Negative.    Cardiovascular: Negative.   Gastrointestinal: Negative.   Endocrine: Negative.   Genitourinary: Negative.   Musculoskeletal: Negative.   Skin: Negative.   Hematological: Negative.   Psychiatric/Behavioral: Negative.     Past Medical History:  Diagnosis Date   Allergic rhinitis    Cancer ([place])    stage "0" breast cancer   Hypothyroidism    PONV (postoperative nausea and vomiting)     Past Surgical History:  Procedure Laterality Date   ABDOMINAL HYSTERECTOMY  2018   BREAST BIOPSY     x4   BREAST RECONSTRUCTION WITH PLACEMENT OF TISSUE EXPANDER AND FLEX HD (ACELLULAR HYDRATED DERMIS) Bilateral 08/13/2018   Procedure: BREAST RECONSTRUCTION WITH PLACEMENT OF TISSUE EXPANDER AND FLEX HD (ACELLULAR HYDRATED DERMIS);  Surgeon: Wallace Going, DO;  Location: [facility];  Service: Plastics;  Laterality: Bilateral;  Dr. Lilia Pro from [facility] will be the first surgeon on the case. Please allow 2.5 hours for his portion of the surgery.   CESAREAN SECTION N/A 2016   cesarean  done when patient had twins   HYSTEROTOMY Bilateral 11/29/2016   Partial all taken but overies.    LIPOMA EXCISION Right 10/29/2018   Procedure: EXCISION RIGHT AXILLARY LIPOMA;  Surgeon: Wallace Going, DO;  Location: [facility];  Service: Plastics;  Laterality: Right;   REMOVAL OF BILATERAL TISSUE EXPANDERS WITH PLACEMENT OF BILATERAL BREAST IMPLANTS Bilateral 10/29/2018   Procedure: Removal of bilateral breast expanders and placement of silicone implants;  Surgeon: Wallace Going, DO;  Location: [facility];  Service: Plastics;  Laterality: Bilateral;  2.5 hours   SIMPLE MASTECTOMY Bilateral 08/13/2018   WITH TISSUE EXPANDERS & RECONSTUCTION   SIMPLE MASTECTOMY WITH AXILLARY SENTINEL NODE BIOPSY Bilateral 08/13/2018   Procedure: BILATERAL SIMPLE MASTECTOMY AND EXCISION OF RIGHT AXILLARY MASS.;  Surgeon: Pollyann Samples, MD;  Location: [facility];  Service: General;  Laterality: Bilateral;   TONSILLECTOMY  1996   TUBAL LIGATION  2016      Current Outpatient Medications:    Cetirizine HCl (ZYRTEC ALLERGY) 10 MG CAPS, Take 1 capsule by mouth at bedtime., Disp: , Rfl:    HYDROQUINONE EX, Apply 1 application topically at bedtime as needed (dark spots)., Disp: , Rfl:    levothyroxine (SYNTHROID) 75 MCG tablet, TAKE 1 TABLET (75 MCG TOTAL) BY MOUTH DAILY BEFORE BREAKFAST., Disp: 90 tablet, Rfl: 3   propranolol (INDERAL) 10 MG tablet, Take 10 mg  by mouth daily., Disp: , Rfl:    Objective:   There were no vitals filed for this visit.  Physical Exam Constitutional:      Appearance: Normal appearance.  HENT:     Head: Normocephalic and atraumatic.  Cardiovascular:     Rate and Rhythm: Normal rate.     Pulses: Normal pulses.  Pulmonary:     Effort: Pulmonary effort is normal.  Abdominal:     General: There is no distension.     Palpations: Abdomen is soft.  Skin:    Capillary Refill: Capillary refill takes less than 2 seconds.     Coloration: Skin is  not jaundiced.     Findings: No bruising or lesion.  Neurological:     Mental Status: She is alert and oriented to person, place, and time.  Psychiatric:        Mood and Affect: Mood normal.        Behavior: Behavior normal.    Assessment & Plan:  Personal history of malignant neoplasm of breast  Malignant neoplasm of right female breast, unspecified estrogen receptor status, unspecified site of breast ([place])  Status post bilateral mastectomy  If the patient wants back feeling she could do that at any time.  She did not want to do that right now and that is perfectly reasonable.  Once she is 3 years out if she wants to do ultrasound bilaterally we can certainly do that as well.  We will plan to see her back in 1 year.  Pictures were obtained of the patient and placed in the chart with the patient's or guardian's permission.   [place], DO

## 2021-04-22 ENCOUNTER — Encounter: Payer: Self-pay | Admitting: Endocrinology

## 2021-04-22 ENCOUNTER — Ambulatory Visit: Payer: BC Managed Care – PPO | Admitting: Endocrinology

## 2021-04-22 VITALS — BP 100/70 | HR 73 | Ht 69.0 in | Wt 152.4 lb

## 2021-04-22 DIAGNOSIS — E061 Subacute thyroiditis: Secondary | ICD-10-CM | POA: Diagnosis not present

## 2021-04-22 LAB — TSH: TSH: 1.6 u[IU]/mL (ref 0.35–5.50)

## 2021-04-22 LAB — T4, FREE: Free T4: 0.93 ng/dL (ref 0.60–1.60)

## 2021-04-22 NOTE — Patient Instructions (Addendum)
Blood tests are requested for you today.  We'll let you know about the results.   ?You should have the thyroid blood tests checked every 6-12 months.   ?

## 2021-04-22 NOTE — Progress Notes (Signed)
? ?Subjective:  ? ? Patient ID: Kaitlin Patterson, female    DOB: January 21, 1975, 46 y.o.   MRN: [id number] ? ?HPI ?Pt returns for f/u of chronic primary hypothyroidism (dx'ed 2018; she chose tapazole rx; US showed small MNG; ENT advised against thyroidectomy; in early 2019, she spontaneously developed hypothyroidism, and started synthroid; she takes inderal PRN, for tremor).  pt states she feels well in general, except for difficulty losing weight ?Past Medical History:  ?Diagnosis Date  ? Allergic rhinitis   ? Cancer [facility])   ? stage "0" breast cancer  ? Hypothyroidism   ? PONV (postoperative nausea and vomiting)   ? ? ?Past Surgical History:  ?Procedure Laterality Date  ? ABDOMINAL HYSTERECTOMY  2018  ? BREAST BIOPSY    ? x4  ? BREAST RECONSTRUCTION WITH PLACEMENT OF TISSUE EXPANDER AND FLEX HD (ACELLULAR HYDRATED DERMIS) Bilateral 08/13/2018  ? Procedure: BREAST RECONSTRUCTION WITH PLACEMENT OF TISSUE EXPANDER AND FLEX HD (ACELLULAR HYDRATED DERMIS);  Surgeon: Wallace Going, DO;  Location: [facility];  Service: Plastics;  Laterality: Bilateral;  Dr. Lilia Pro from [facility] will be the first surgeon on the case. Please allow 2.5 hours for his portion of the surgery.  ? CESAREAN SECTION N/A 2016  ? cesarean done when patient had twins  ? HYSTEROTOMY Bilateral 11/29/2016  ? Partial all taken but overies.   ? LIPOMA EXCISION Right 10/29/2018  ? Procedure: EXCISION RIGHT AXILLARY LIPOMA;  Surgeon: Wallace Going, DO;  Location: [facility];  Service: Plastics;  Laterality: Right;  ? REMOVAL OF BILATERAL TISSUE EXPANDERS WITH PLACEMENT OF BILATERAL BREAST IMPLANTS Bilateral 10/29/2018  ? Procedure: Removal of bilateral breast expanders and placement of silicone implants;  Surgeon: Wallace Going, DO;  Location: [facility];  Service: Plastics;  Laterality: Bilateral;  2.5 hours  ? SIMPLE MASTECTOMY Bilateral 08/13/2018  ? WITH TISSUE EXPANDERS & RECONSTUCTION  ? SIMPLE MASTECTOMY WITH  AXILLARY SENTINEL NODE BIOPSY Bilateral 08/13/2018  ? Procedure: BILATERAL SIMPLE MASTECTOMY AND EXCISION OF RIGHT AXILLARY MASS.;  Surgeon: Pollyann Samples, MD;  Location: [facility];  Service: General;  Laterality: Bilateral;  ? TONSILLECTOMY  1996  ? TUBAL LIGATION  2016  ? ? ?Social History  ? ?Socioeconomic History  ? Marital status: Married  ?  Spouse name: Not on file  ? Number of children: Not on file  ? Years of education: Not on file  ? Highest education level: Not on file  ?Occupational History  ? Not on file  ?Tobacco Use  ? Smoking status: Never  ? Smokeless tobacco: Never  ?Vaping Use  ? Vaping Use: Never used  ?Substance and Sexual Activity  ? Alcohol use: No  ? Drug use: No  ? Sexual activity: Not Currently  ?  Partners: Male  ?Other Topics Concern  ? Not on file  ?Social History Narrative  ? Not on file  ? ?Social Determinants of Health  ? ?Financial Resource Strain: Not on file  ?Food Insecurity: Not on file  ?Transportation Needs: Not on file  ?Physical Activity: Not on file  ?Stress: Not on file  ?Social Connections: Not on file  ?Intimate Partner Violence: Not on file  ? ? ?Current Outpatient Medications on File Prior to Visit  ?Medication Sig Dispense Refill  ? Cetirizine HCl (ZYRTEC ALLERGY) 10 MG CAPS Take 1 capsule by mouth at bedtime.    ? HYDROQUINONE EX Apply 1 application topically at bedtime as needed (dark spots).    ? levothyroxine (  SYNTHROID) 75 MCG tablet TAKE 1 TABLET (75 MCG TOTAL) BY MOUTH DAILY BEFORE BREAKFAST. 90 tablet 3  ? propranolol (INDERAL) 10 MG tablet Take 10 mg by mouth daily.    ? ?No current facility-administered medications on file prior to visit.  ? ? ? ?Family History  ?Problem Relation Age of Onset  ? Hypothyroidism Mother   ? ? ?BP 100/70 (BP Location: Left Arm, Patient Position: Sitting, Cuff Size: Normal)   Pulse 73   Ht '5\' 9"'$  (1.753 m)   Wt 152 lb 6.4 oz (69.1 kg)   LMP 11/05/2016 (Approximate)   SpO2 97%   BMI 22.51 kg/m?  ? ? ?Review of Systems ? ?    ?Objective:  ? Physical Exam ?VITAL SIGNS:  See vs page ?GENERAL: no distress ?NECK: There is no palpable thyroid enlargement.  No thyroid nodule is palpable.  No palpable lymphadenopathy at the anterior neck.   ? ? ?Lab Results  ?Component Value Date  ? TSH 1.60 04/22/2021  ? ?   ?Assessment & Plan:  ?Hypothyroidism: well-controlled.  Please continue the same synthroid.   ? ?

## 2021-06-18 ENCOUNTER — Other Ambulatory Visit: Payer: Self-pay

## 2021-06-18 DIAGNOSIS — E061 Subacute thyroiditis: Secondary | ICD-10-CM

## 2021-06-18 MED ORDER — LEVOTHYROXINE SODIUM 75 MCG PO TABS: 75.0000 ug | ORAL_TABLET | Freq: Every day | ORAL | 3 refills | Status: DC

## 2021-10-19 ENCOUNTER — Ambulatory Visit: Payer: BC Managed Care – PPO | Admitting: Internal Medicine

## 2022-01-28 ENCOUNTER — Ambulatory Visit: Payer: BC Managed Care – PPO | Admitting: Podiatry

## 2022-01-28 DIAGNOSIS — L603 Nail dystrophy: Secondary | ICD-10-CM | POA: Diagnosis not present

## 2022-01-28 DIAGNOSIS — B351 Tinea unguium: Secondary | ICD-10-CM | POA: Diagnosis not present

## 2022-01-28 DIAGNOSIS — L600 Ingrowing nail: Secondary | ICD-10-CM

## 2022-01-28 NOTE — Progress Notes (Signed)
Subjective:  Patient ID: Kaitlin Patterson, female    DOB: 1975/12/16,  MRN: [id number]  Chief Complaint  Patient presents with   Nail Problem    Pt states both nails on big toes are lifting and growing on top of each other , possibly ingrown on big toe of right foot     47 y.o. female presents presenting for nail dystrophy on both great toes left worse than right.  She says there is pain on the outside border of the left great toenail.  She has previously taken 3 months course of terbinafine for nail fungal infection and says that the nails started to lift off the nailbed and grow abnormally after taking this.  She denies pain in the right nail just says it is growing little abnormally.  Past Medical History:  Diagnosis Date   Allergic rhinitis    Cancer ([place])    stage "0" breast cancer   Hypothyroidism    PONV (postoperative nausea and vomiting)     Allergies  Allergen Reactions   Codeine Nausea Only   Adhesive [Tape] Itching and Other (See Comments)    redness   Penicillins Rash    Did it involve swelling of the face/tongue/throat, SOB, or low BP? Unknown Did it involve sudden or severe rash/hives, skin peeling, or any reaction on the inside of your mouth or nose? Unknown Did you need to seek medical attention at a hospital or doctor's office? Unknown When did it last happen? Childhood reaction If all above answers are "NO", may proceed with cephalosporin use.     ROS: Negative except as per HPI above  Objective:  General: AAO x3, NAD  Dermatological: Attention directed to the left hallux there is noted to be significant dystrophy onycholysis and pain with palpation of the lateral proximal base of the left hallux toenail.  Mild incision edema and erythema at the lateral proximal nail base.  The right hallux nail also has onycholysis and is coming off the nail plate somewhat.  Vascular:  Dorsalis Pedis artery and Posterior Tibial artery pedal pulses are 2/4 bilateral.  Capillary  fill time < 3 sec to all digits.   Neruologic: Grossly intact via light touch bilateral. Protective threshold intact to all sites bilateral.   Musculoskeletal: No gross boney pedal deformities bilateral. No pain, crepitus, or limitation noted with foot and ankle range of motion bilateral. Muscular strength 5/5 in all groups tested bilateral.  Gait: Unassisted, Nonantalgic.   No images are attached to the encounter.  Assessment:   1. Ingrowing nail, left great toe   2. Onychomycosis   3. Nail dystrophy      Plan:  Patient was evaluated and treated and all questions answered.   Ingrown Nail, left related to fungal dystrophy -Discussed at length options with the patient for her painful ingrown nail on the left foot.  I trimmed the nail back and there is very little nail remaining at the proximal base.  I recommended removal of the entire nail.  We will it to regrow and see if it grows back without fungal dystrophy.  She is aware that it could do so and if it does she may need another procedure in the future but this is what she would prefer rather than doing a permanent removal at this time. -In regards to the right hallux nail we will continue to monitor discussed possible removal in the future if she wishes but we will defer on that as now she is not having pain  there. -Patient elects to proceed with minor surgery to remove ingrown toenail today. Consent reviewed and signed by patient. -Ingrown nail excised. See procedure note. -Educated on post-procedure care including soaking. Written instructions provided and reviewed. -Patient to follow up in 2 weeks for nail check.  Procedure: Excision of Ingrown Toenail - Total nail avulsion Location: Left 1st toe total nail  Anesthesia: Lidocaine 1% plain; 1.5 mL and Marcaine 0.5% plain; 1.5 mL, digital block. Skin Prep: Betadine. Dressing: Silvadene; telfa; dry, sterile, compression dressing. Technique: Following skin prep, the toe was  exsanguinated and a tourniquet was secured at the base of the toe. The affected nail border was freed, split with a nail splitter, and excised. Nail bed was irrigated out with alcohol. The tourniquet was then removed and sterile dressing applied. Disposition: Patient tolerated procedure well. Patient to return in 2 weeks for follow-up.    Return in about 2 weeks (around 02/11/2022) for F/u L hallux total avulsion.          Everitt Amber, DPM [facility]

## 2022-01-28 NOTE — Patient Instructions (Signed)

## 2022-02-14 ENCOUNTER — Ambulatory Visit: Payer: BC Managed Care – PPO | Admitting: Podiatry

## 2022-02-14 ENCOUNTER — Encounter: Payer: Self-pay | Admitting: Podiatry

## 2022-02-14 VITALS — BP 98/61 | HR 85

## 2022-02-14 DIAGNOSIS — B351 Tinea unguium: Secondary | ICD-10-CM

## 2022-02-14 DIAGNOSIS — L603 Nail dystrophy: Secondary | ICD-10-CM

## 2022-02-14 DIAGNOSIS — L600 Ingrowing nail: Secondary | ICD-10-CM | POA: Diagnosis not present

## 2022-02-14 MED ORDER — CICLOPIROX 8 % EX SOLN: Freq: Every day | CUTANEOUS | 0 refills | Status: AC

## 2022-02-14 NOTE — Progress Notes (Signed)
Subjective: Kaitlin Patterson is a 47 y.o.  female returns to office today for follow up evaluation after having left Hallux nail total nail avulsion removal  approximately 2 weeks ago. Patient has been soaking using epsom salt and applying topical antibiotic covered with bandaid daily. Patient denies fevers, chills, nausea, vomiting. Denies any calf pain, chest pain, SOB.   Objective:  Vitals: Reviewed  General: Well developed, nourished, in no acute distress, alert and oriented x3   Dermatology: Skin is warm, dry and supple bilateral. left hallux nail bed appears to be clean, dry, with mild granular tissue and surrounding scab. There is no surrounding erythema, edema, drainage/purulence. The remaining nails appear unremarkable at this time. There are no other lesions or other signs of infection present.  Neurovascular status: Intact. No lower extremity swelling; No pain with calf compression bilateral.  Musculoskeletal: Decreased tenderness to palpation of the left hallux nail bed. Muscular strength within normal limits bilateral.   Assesement and Plan: S/p total nail avulsion left hallux nail doing well.  #nail dystrophy 2/2 fungal nail infection  -Continue soaking in epsom salts twice a day followed by antibiotic ointment and a band-aid. Can leave uncovered at night. Continue this until completely healed.  -If the area has not healed in 2 weeks, call the office for follow-up appointment, or sooner if any problems arise.  -Monitor for any signs/symptoms of infection. Call the office immediately if any occur or go directly to the emergency room. Call with any questions/concerns. - erx for penlac 8% solution to apply to left hallux nail as it regrows daily for nail fungal infection         Everitt Amber, [place] / [facility]                   02/14/2022

## 2022-02-15 ENCOUNTER — Encounter: Payer: Self-pay | Admitting: Plastic Surgery

## 2022-02-15 ENCOUNTER — Ambulatory Visit: Payer: BC Managed Care – PPO | Admitting: Plastic Surgery

## 2022-02-15 DIAGNOSIS — C50911 Malignant neoplasm of unspecified site of right female breast: Secondary | ICD-10-CM | POA: Diagnosis not present

## 2022-02-15 DIAGNOSIS — Z08 Encounter for follow-up examination after completed treatment for malignant neoplasm: Secondary | ICD-10-CM

## 2022-02-15 DIAGNOSIS — Z9013 Acquired absence of bilateral breasts and nipples: Secondary | ICD-10-CM

## 2022-02-15 NOTE — Progress Notes (Signed)
Patient ID: Kaitlin Patterson, female    DOB: 10-Jul-1975, 47 y.o.   MRN: [id number]   Chief Complaint  Patient presents with   Follow-up   Breast Problem    The patient is a 47 year old female here for her yearly follow-up on breast reconstruction.  She saw me for a breast reduction and then had mammogram which showed some abnormalities which led to MRI and biopsy showing right breast cancer.  She is 5 feet 9 inches tall and had gained some weight up to 160 pounds but she has now lost it and is doing much better.  She underwent mastectomies on August 2020 and then had the expanders placed.  The expanders were switched to implants in October 2020.  She has Mentor smooth round ultrahigh profile 430 cc implants on each side.  Overall she is doing really well she has some irregularity in her contour particularly when she leans forward but she does not want to do any fat grafting right now.  That is an option for her.  She also had nipple areola tattooing and looks really good.    Review of Systems  Constitutional: Negative.   Eyes: Negative.   Respiratory: Negative.    Cardiovascular: Negative.   Gastrointestinal: Negative.   Endocrine: Negative.   Genitourinary: Negative.   Musculoskeletal: Negative.   Neurological: Negative.     Past Medical History:  Diagnosis Date   Allergic rhinitis    Cancer ([place])    stage "0" breast cancer   Hypothyroidism    PONV (postoperative nausea and vomiting)     Past Surgical History:  Procedure Laterality Date   ABDOMINAL HYSTERECTOMY  2018   BREAST BIOPSY     x4   BREAST RECONSTRUCTION WITH PLACEMENT OF TISSUE EXPANDER AND FLEX HD (ACELLULAR HYDRATED DERMIS) Bilateral 08/13/2018   Procedure: BREAST RECONSTRUCTION WITH PLACEMENT OF TISSUE EXPANDER AND FLEX HD (ACELLULAR HYDRATED DERMIS);  Surgeon: Wallace Going, DO;  Location: [place];  Service: Plastics;  Laterality: Bilateral;  Dr. Lilia Pro from [place] will be the first surgeon on the case.  Please allow 2.5 hours for his portion of the surgery.   CESAREAN SECTION N/A 2016   cesarean done when patient had twins   HYSTEROTOMY Bilateral 11/29/2016   Partial all taken but overies.    LIPOMA EXCISION Right 10/29/2018   Procedure: EXCISION RIGHT AXILLARY LIPOMA;  Surgeon: Wallace Going, DO;  Location: [place];  Service: Plastics;  Laterality: Right;   REMOVAL OF BILATERAL TISSUE EXPANDERS WITH PLACEMENT OF BILATERAL BREAST IMPLANTS Bilateral 10/29/2018   Procedure: Removal of bilateral breast expanders and placement of silicone implants;  Surgeon: Wallace Going, DO;  Location: [place];  Service: Plastics;  Laterality: Bilateral;  2.5 hours   SIMPLE MASTECTOMY Bilateral 08/13/2018   WITH TISSUE EXPANDERS & RECONSTUCTION   SIMPLE MASTECTOMY WITH AXILLARY SENTINEL NODE BIOPSY Bilateral 08/13/2018   Procedure: BILATERAL SIMPLE MASTECTOMY AND EXCISION OF RIGHT AXILLARY MASS.;  Surgeon: Pollyann Samples, MD;  Location: [place];  Service: General;  Laterality: Bilateral;   TONSILLECTOMY  1996   TUBAL LIGATION  2016      Current Outpatient Medications:    Cetirizine HCl (ZYRTEC ALLERGY) 10 MG CAPS, Take 1 capsule by mouth at bedtime., Disp: , Rfl:    ciclopirox (PENLAC) 8 % solution, Apply topically at bedtime. Apply over nail and surrounding skin. Apply daily over previous coat. After seven (7) days, may remove with alcohol and  continue cycle., Disp: 6.6 mL, Rfl: 0   HYDROQUINONE EX, Apply 1 application topically at bedtime as needed (dark spots)., Disp: , Rfl:    levothyroxine (SYNTHROID) 75 MCG tablet, Take 1 tablet (75 mcg total) by mouth daily before breakfast., Disp: 90 tablet, Rfl: 3   phentermine 37.5 MG capsule, Take by mouth., Disp: , Rfl:    propranolol (INDERAL) 10 MG tablet, Take 10 mg by mouth daily., Disp: , Rfl:    Objective:   There were no vitals filed for this visit.  Physical Exam Constitutional:      Appearance: Normal  appearance.  HENT:     Head: Normocephalic and atraumatic.  Cardiovascular:     Rate and Rhythm: Normal rate.     Pulses: Normal pulses.  Pulmonary:     Effort: Pulmonary effort is normal.  Abdominal:     Palpations: Abdomen is soft.  Skin:    General: Skin is warm.     Capillary Refill: Capillary refill takes less than 2 seconds.     Coloration: Skin is not jaundiced.     Findings: No bruising.  Neurological:     Mental Status: She is alert and oriented to person, place, and time.  Psychiatric:        Mood and Affect: Mood normal.        Behavior: Behavior normal.        Thought Content: Thought content normal.        Judgment: Judgment normal.     Assessment & Plan:  Malignant neoplasm of right female breast, unspecified estrogen receptor status, unspecified site of breast ([place])  Acquired absence of bilateral breasts and nipples  Follow-up in 1 year.  Pictures taken and placed in the chart with the patient's permission.  She is due for ultrasound evaluation and we will get that ordered for her.  And she knows that if it anytime she wants to do fat grafting she can certainly do that.  Will plan to see her back in a year and I will let her know of the ultrasound results as soon as we get them back.  [place], DO

## 2022-03-17 ENCOUNTER — Telehealth: Payer: Self-pay | Admitting: Physician Assistant

## 2022-03-17 NOTE — Telephone Encounter (Signed)
Has clinical questions about [place] referral sent to them, please advise

## 2022-05-06 ENCOUNTER — Other Ambulatory Visit: Payer: Self-pay | Admitting: Internal Medicine

## 2022-05-06 DIAGNOSIS — E061 Subacute thyroiditis: Secondary | ICD-10-CM

## 2023-02-13 ENCOUNTER — Other Ambulatory Visit (INDEPENDENT_AMBULATORY_CARE_PROVIDER_SITE_OTHER): Payer: Self-pay | Admitting: Physician Assistant

## 2023-02-13 DIAGNOSIS — Z9013 Acquired absence of bilateral breasts and nipples: Secondary | ICD-10-CM

## 2023-02-21 ENCOUNTER — Ambulatory Visit: Payer: BC Managed Care – PPO | Admitting: Plastic Surgery

## 2023-03-28 ENCOUNTER — Ambulatory Visit: Payer: BC Managed Care – PPO | Admitting: Plastic Surgery

## 2023-03-28 ENCOUNTER — Encounter: Payer: Self-pay | Admitting: Plastic Surgery

## 2023-03-28 DIAGNOSIS — Z9013 Acquired absence of bilateral breasts and nipples: Secondary | ICD-10-CM

## 2023-03-28 DIAGNOSIS — C50911 Malignant neoplasm of unspecified site of right female breast: Secondary | ICD-10-CM

## 2023-03-28 DIAGNOSIS — Z853 Personal history of malignant neoplasm of breast: Secondary | ICD-10-CM

## 2023-03-28 DIAGNOSIS — Z08 Encounter for follow-up examination after completed treatment for malignant neoplasm: Secondary | ICD-10-CM | POA: Diagnosis not present

## 2023-03-28 NOTE — Progress Notes (Signed)
 Patient ID: Kaitlin Patterson, female    DOB: 1975-02-17, 48 y.o.   MRN: [id number]   Chief Complaint  Patient presents with   Breast Problem    The patient is a 48 year old female here for a 1 year follow-up after breast reconstruction.  She was originally seen in 2020 with consult for breast reduction.  She had a mammogram done prior to the reduction and was found to have some irregularities which ended up with a diagnosis of right sided breast cancer.  She was 5 feet 9 inches and 141 pounds at the time with a preoperative bra size of a 32G.  She underwent bilateral immediate breast reconstruction with placement of expanders in August 2020.  Then she had the expanders switched to implants in October 2020.  She currently has Mentor smooth round ultrahigh profile gel 430 cc implants.  She is pleased with her results.  She has not had any issues and is not interested in any revision at this time.  She had ultrasound last year and there were no issues.  She is not interested in any additional imaging at this time.  There are no areas of concern or lumps or bumps noted.    Review of Systems  Constitutional: Negative.   HENT: Negative.    Eyes: Negative.   Respiratory: Negative.    Cardiovascular: Negative.   Gastrointestinal: Negative.   Endocrine: Negative.   Genitourinary: Negative.   Musculoskeletal: Negative.     Past Medical History:  Diagnosis Date   Allergic rhinitis    Cancer ([facility])    stage "0" breast cancer   Hypothyroidism    PONV (postoperative nausea and vomiting)     Past Surgical History:  Procedure Laterality Date   ABDOMINAL HYSTERECTOMY  2018   BREAST BIOPSY     x4   BREAST RECONSTRUCTION WITH PLACEMENT OF TISSUE EXPANDER AND FLEX HD (ACELLULAR HYDRATED DERMIS) Bilateral 08/13/2018   Procedure: BREAST RECONSTRUCTION WITH PLACEMENT OF TISSUE EXPANDER AND FLEX HD (ACELLULAR HYDRATED DERMIS);  Surgeon: Peggye Form, DO;  Location[facility];  Service: Plastics;   Laterality: Bilateral;  Dr. Lequita Halt from [facility]  will be the first surgeon on the case. Please allow 2.5 hours for his portion of the surgery.   CESAREAN SECTION N/A 2016   cesarean done when patient had twins   HYSTEROTOMY Bilateral 11/29/2016   Partial all taken but overies.    LIPOMA EXCISION Right 10/29/2018   Procedure: EXCISION RIGHT AXILLARY LIPOMA;  Surgeon: Peggye Form, DO;  Location: [facility];  Service: Plastics;  Laterality: Right;   REMOVAL OF BILATERAL TISSUE EXPANDERS WITH PLACEMENT OF BILATERAL BREAST IMPLANTS Bilateral 10/29/2018   Procedure: Removal of bilateral breast expanders and placement of silicone implants;  Surgeon: Peggye Form, DO;  Location: [facility];  Service: Plastics;  Laterality: Bilateral;  2.5 hours   SIMPLE MASTECTOMY Bilateral 08/13/2018   WITH TISSUE EXPANDERS & RECONSTUCTION   SIMPLE MASTECTOMY WITH AXILLARY SENTINEL NODE BIOPSY Bilateral 08/13/2018   Procedure: BILATERAL SIMPLE MASTECTOMY AND EXCISION OF RIGHT AXILLARY MASS.;  Surgeon: Darleene Cleaver, MD;  Location: [facility];  Service: General;  Laterality: Bilateral;   TONSILLECTOMY  1996   TUBAL LIGATION  2016      Current Outpatient Medications:    Cetirizine HCl (ZYRTEC ALLERGY) 10 MG CAPS, Take 1 capsule by mouth at bedtime., Disp: , Rfl:    ciclopirox (PENLAC) 8 % solution, Apply topically at bedtime. Apply over  nail and surrounding skin. Apply daily over previous coat. After seven (7) days, may remove with alcohol and continue cycle., Disp: 6.6 mL, Rfl: 0   HYDROQUINONE EX, Apply 1 application topically at bedtime as needed (dark spots)., Disp: , Rfl:    levothyroxine (SYNTHROID) 75 MCG tablet, TAKE 1 TABLET BY MOUTH DAILY BEFORE BREAKFAST., Disp: 30 tablet, Rfl: 0   phentermine 37.5 MG capsule, Take by mouth., Disp: , Rfl:    propranolol (INDERAL) 10 MG tablet, Take 10 mg by mouth daily., Disp: , Rfl:    Objective:   There were no vitals  filed for this visit.  Physical Exam Vitals reviewed.  Constitutional:      Appearance: Normal appearance.  HENT:     Head: Atraumatic.  Cardiovascular:     Rate and Rhythm: Normal rate.     Pulses: Normal pulses.  Pulmonary:     Effort: Pulmonary effort is normal.  Abdominal:     Palpations: Abdomen is soft.  Skin:    General: Skin is warm.     Capillary Refill: Capillary refill takes less than 2 seconds.  Neurological:     Mental Status: She is alert and oriented to person, place, and time.  Psychiatric:        Mood and Affect: Mood normal.        Behavior: Behavior normal.        Thought Content: Thought content normal.        Judgment: Judgment normal.     Assessment & Plan:  Acquired absence of bilateral breasts and nipples  Personal history of malignant neoplasm of breast  Malignant neoplasm of right female breast, unspecified estrogen receptor status, unspecified site of breast ([facility])  Plan to see the patient back in 1 year.  No imaging needed at this time.  If the patient wants to do some fat grafting that certainly an option in the future.  Pictures were obtained of the patient and placed in the chart with the patient's or guardian's permission.   Alena Bills Alexis Reber, DO

## 2024-03-26 ENCOUNTER — Ambulatory Visit: Admitting: Plastic Surgery
